# Patient Record
Sex: Female | Born: 1975 | Race: White | Hispanic: No | Marital: Married | Smoking: Former smoker
Health system: Southern US, Community
[De-identification: ages and names within clinical notes are randomized; demographics above are authoritative.]

## PROBLEM LIST (undated history)

## (undated) DIAGNOSIS — M1990 Unspecified osteoarthritis, unspecified site: Secondary | ICD-10-CM

## (undated) DIAGNOSIS — C801 Malignant (primary) neoplasm, unspecified: Secondary | ICD-10-CM

## (undated) HISTORY — PX: KNEE SURGERY: SHX244

## (undated) HISTORY — PX: FOOT SURGERY: SHX648

## (undated) HISTORY — DX: Unspecified osteoarthritis, unspecified site: M19.90

## (undated) HISTORY — PX: BREAST REDUCTION SURGERY: SHX8

## (undated) HISTORY — PX: TONSILLECTOMY: SUR1361

---

## 1998-06-02 ENCOUNTER — Encounter: Admission: RE | Admit: 1998-06-02 | Discharge: 1998-08-31 | Payer: Self-pay | Admitting: Gynecology

## 1998-06-02 ENCOUNTER — Other Ambulatory Visit: Admission: RE | Admit: 1998-06-02 | Discharge: 1998-06-02 | Payer: Self-pay | Admitting: Gynecology

## 1998-12-09 ENCOUNTER — Encounter: Payer: Self-pay | Admitting: Gynecology

## 1998-12-09 ENCOUNTER — Ambulatory Visit (HOSPITAL_COMMUNITY): Admission: RE | Admit: 1998-12-09 | Discharge: 1998-12-09 | Payer: Self-pay | Admitting: Gynecology

## 1999-04-11 ENCOUNTER — Other Ambulatory Visit: Admission: RE | Admit: 1999-04-11 | Discharge: 1999-04-11 | Payer: Self-pay | Admitting: Gynecology

## 1999-04-21 ENCOUNTER — Encounter: Admission: RE | Admit: 1999-04-21 | Discharge: 1999-07-20 | Payer: Self-pay | Admitting: Gynecology

## 1999-11-15 ENCOUNTER — Encounter (INDEPENDENT_AMBULATORY_CARE_PROVIDER_SITE_OTHER): Payer: Self-pay

## 1999-11-15 ENCOUNTER — Inpatient Hospital Stay (HOSPITAL_COMMUNITY): Admission: AD | Admit: 1999-11-15 | Discharge: 1999-11-18 | Payer: Self-pay | Admitting: Internal Medicine

## 1999-11-19 ENCOUNTER — Encounter: Admission: RE | Admit: 1999-11-19 | Discharge: 1999-12-19 | Payer: Self-pay | Admitting: Gynecology

## 1999-12-22 ENCOUNTER — Other Ambulatory Visit: Admission: RE | Admit: 1999-12-22 | Discharge: 1999-12-22 | Payer: Self-pay | Admitting: Gynecology

## 2000-09-21 ENCOUNTER — Encounter: Payer: Self-pay | Admitting: *Deleted

## 2000-09-21 ENCOUNTER — Encounter: Admission: RE | Admit: 2000-09-21 | Discharge: 2000-09-21 | Payer: Self-pay | Admitting: *Deleted

## 2000-12-26 ENCOUNTER — Other Ambulatory Visit: Admission: RE | Admit: 2000-12-26 | Discharge: 2000-12-26 | Payer: Self-pay | Admitting: Gynecology

## 2001-07-05 ENCOUNTER — Emergency Department (HOSPITAL_COMMUNITY): Admission: EM | Admit: 2001-07-05 | Discharge: 2001-07-05 | Payer: Self-pay | Admitting: Emergency Medicine

## 2001-09-02 ENCOUNTER — Encounter: Admission: RE | Admit: 2001-09-02 | Discharge: 2001-09-02 | Payer: Self-pay | Admitting: Family Medicine

## 2001-09-02 ENCOUNTER — Encounter: Payer: Self-pay | Admitting: Family Medicine

## 2001-10-21 ENCOUNTER — Ambulatory Visit (HOSPITAL_BASED_OUTPATIENT_CLINIC_OR_DEPARTMENT_OTHER): Admission: RE | Admit: 2001-10-21 | Discharge: 2001-10-21 | Payer: Self-pay | Admitting: Specialist

## 2001-10-21 ENCOUNTER — Encounter (INDEPENDENT_AMBULATORY_CARE_PROVIDER_SITE_OTHER): Payer: Self-pay | Admitting: *Deleted

## 2002-06-10 ENCOUNTER — Other Ambulatory Visit: Admission: RE | Admit: 2002-06-10 | Discharge: 2002-06-10 | Payer: Self-pay | Admitting: Gynecology

## 2002-07-03 ENCOUNTER — Encounter: Admission: RE | Admit: 2002-07-03 | Discharge: 2002-10-01 | Payer: Self-pay | Admitting: Gynecology

## 2003-07-17 ENCOUNTER — Other Ambulatory Visit: Admission: RE | Admit: 2003-07-17 | Discharge: 2003-07-17 | Payer: Self-pay | Admitting: Gynecology

## 2003-10-13 ENCOUNTER — Encounter: Admission: RE | Admit: 2003-10-13 | Discharge: 2003-11-18 | Payer: Self-pay | Admitting: Gynecology

## 2003-10-25 ENCOUNTER — Inpatient Hospital Stay (HOSPITAL_COMMUNITY): Admission: AD | Admit: 2003-10-25 | Discharge: 2003-10-25 | Payer: Self-pay | Admitting: Gynecology

## 2004-01-28 ENCOUNTER — Encounter (INDEPENDENT_AMBULATORY_CARE_PROVIDER_SITE_OTHER): Payer: Self-pay | Admitting: *Deleted

## 2004-01-28 ENCOUNTER — Inpatient Hospital Stay (HOSPITAL_COMMUNITY): Admission: RE | Admit: 2004-01-28 | Discharge: 2004-01-31 | Payer: Self-pay | Admitting: Gynecology

## 2005-03-05 ENCOUNTER — Emergency Department (HOSPITAL_COMMUNITY): Admission: EM | Admit: 2005-03-05 | Discharge: 2005-03-05 | Payer: Self-pay | Admitting: Emergency Medicine

## 2006-05-11 ENCOUNTER — Ambulatory Visit (HOSPITAL_BASED_OUTPATIENT_CLINIC_OR_DEPARTMENT_OTHER): Admission: RE | Admit: 2006-05-11 | Discharge: 2006-05-11 | Payer: Self-pay | Admitting: Orthopedic Surgery

## 2006-05-28 ENCOUNTER — Other Ambulatory Visit: Admission: RE | Admit: 2006-05-28 | Discharge: 2006-05-28 | Payer: Self-pay | Admitting: Gynecology

## 2006-05-28 ENCOUNTER — Encounter: Payer: Self-pay | Admitting: Internal Medicine

## 2006-10-08 ENCOUNTER — Encounter: Payer: Self-pay | Admitting: Internal Medicine

## 2007-09-10 ENCOUNTER — Emergency Department (HOSPITAL_BASED_OUTPATIENT_CLINIC_OR_DEPARTMENT_OTHER): Admission: EM | Admit: 2007-09-10 | Discharge: 2007-09-10 | Payer: Self-pay | Admitting: Emergency Medicine

## 2008-01-16 ENCOUNTER — Emergency Department (HOSPITAL_BASED_OUTPATIENT_CLINIC_OR_DEPARTMENT_OTHER): Admission: EM | Admit: 2008-01-16 | Discharge: 2008-01-16 | Payer: Self-pay | Admitting: Emergency Medicine

## 2008-03-05 ENCOUNTER — Ambulatory Visit: Payer: Self-pay | Admitting: Women's Health

## 2008-03-09 ENCOUNTER — Ambulatory Visit: Payer: Self-pay | Admitting: Gynecology

## 2008-08-27 ENCOUNTER — Other Ambulatory Visit: Admission: RE | Admit: 2008-08-27 | Discharge: 2008-08-27 | Payer: Self-pay | Admitting: Gynecology

## 2008-08-27 ENCOUNTER — Ambulatory Visit: Payer: Self-pay | Admitting: Women's Health

## 2008-08-27 ENCOUNTER — Encounter: Payer: Self-pay | Admitting: Women's Health

## 2008-09-24 ENCOUNTER — Ambulatory Visit: Payer: Self-pay | Admitting: Women's Health

## 2009-02-08 ENCOUNTER — Ambulatory Visit: Payer: Self-pay | Admitting: Women's Health

## 2009-02-22 ENCOUNTER — Ambulatory Visit: Payer: Self-pay | Admitting: Women's Health

## 2010-02-17 ENCOUNTER — Ambulatory Visit: Payer: Self-pay | Admitting: Women's Health

## 2010-04-19 NOTE — Letter (Signed)
Summary: [facility]   Imported By: Maryln Gottron 09/01/2009 11:00:05  _____________________________________________________________________  External Attachment:    Type:   Image     Comment:   External Document

## 2010-08-05 NOTE — Op Note (Signed)
NAMEMARIBELLE, Stacey Arroyo                             ACCOUNT NO.:  [id number]   MEDICAL RECORD NO.:  [id number]                   PATIENT TYPE:   LOCATION:                                       FACILITY:   PHYSICIAN:  Gerald L. Shon Hough, M.D.           DATE OF BIRTH:   DATE OF PROCEDURE:  10/21/2001  DATE OF DISCHARGE:                                 OPERATIVE REPORT   INDICATIONS:  A 35 year old lady who today on October 21, 2001, is  contemplating bilateral breast reductions.  Preoperative instructions were given as well as potential risks and possible  complications.  The patient wants to be a small C cup.   PROCEDURES PLANNED:  Bilateral breast reductions using the inferior pedicle  technique.  Bra size preoperative triple D to E.   PROCEDURE:  Preoperatively patient is set up and drawn for the inferior  pedicle reduction mammoplasty.  Drawings were done, remarking the nipple  areolar complexes back to 20 cm from the suprasternal notch.  The patient  then underwent general anesthesia intubated orally.  Prep was done to the  chest/breast areas in a routine fashion using Betadine soap and solution,  walled off with sterile towels, and draped so as to make a sterile field.  The wounds were scored with #15-blade.  The skin of the inferior pedicle was  de-epithelialized using #20 blade.  Medial and lateral fatty __________  pedicles were excised down to the underlying fascia.  Hemostasis was  maintained with the Bovie under anticoagulation.  The new keyhole area was  debulked as well as out laterally and excessive amounts of accessory breast  tissue were taken directly and then more taken over the latissimus dorsi in  the upper axillary regions using liposuctional assistance.  Texas catheter  28-3 or 4.  After removing the copious amounts of that, the flaps were then  transposed and stayed with 3-0 Prolene.  Subcutaneous closure was done with  3-0 Monocryl x2 layers, then a running  subcuticular stitch of 3-0 Monocryl  and 5-0 Monocryl throughout the inverted T.  The wounds were drained with 10-  Blake drains, fully fluted, placing __________  lateral portion of the  incision and secured with 3-0 Prolene.  The wounds were cleansed.  The  nipple areolar complexes had excellent blood supply at the end of the case  and gave symmetry to both breasts.  Same procedure was carried out on both  sides.  She was then taken to recovery in excellent condition.   ESTIMATED BLOOD LOSS:  150 cc.   SURGEON:  Yaakov Guthrie. Shon Hough, M.D.   ASSISTANT:  Alethia Berthold, first assistant.  Yaakov Guthrie. Shon Hough, M.D.   GLT/MEDQ  D:  10/21/2001  T:  10/23/2001  Job:  [id number]   cc:   Earvin Hansen L. Shon Hough, M.D.  [address]  [place]  [place] [address]  Fax: [phone]

## 2010-08-05 NOTE — Op Note (Signed)
[name], [name]                 ACCOUNT NO.:  [id number]   MEDICAL RECORD NO.:  [id number]          PATIENT TYPE:  INP   LOCATION:  9110                          [facility]ILITY:  [facility]   PHYSICIAN:  Juan H. Lily Peer, M.D.DATE OF BIRTH:  08-19-1975   DATE OF PROCEDURE:  01/28/2004  DATE OF DISCHARGE:                                 OPERATIVE REPORT   INDICATION FOR OPERATION:  A 35 year old gravida 3, para 1, AB 1 at 80 to 64  weeks estimated gestational age who has had a previous cesarean section.  For elective repeat.  The patient, during her 2nd trimester of pregnancy,  had been exposed to parvovirus B19 from her daughter; and the patient's  titers were found to be elevated, and she was followed with serial  ultrasound with no evidence of hydrops or any gross abnormality.  She  continued to do well and developed gestational diabetes and was on insulin  and had antepartum testing twice a week until the time of her delivery.   PREOPERATIVE DIAGNOSES:  1.  Term intrauterine pregnancy at 68 to 73 weeks estimated gestational age.  2.  Previous cesarean section.  Elective repeat.  3.  Gestational diabetes.  On insulin.  4.  Maternal obesity.  5.  Suspected large-for-gestational-age baby.  6.  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   POSTOPERATIVE DIAGNOSES:  1.  Term intrauterine pregnancy at 60 to 70 weeks estimated gestational age.  2.  Previous cesarean section.  Elective repeat.  3.  Gestational diabetes.  On insulin.  4.  Maternal obesity.  5.  Suspected large-for-gestational-age baby.  6.  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   ANESTHESIA:  Spinal.   SURGEON:  Juan H. Lily Peer, M.D.   FIRST ASSISTANT:  Ivor Costa. Farrel Gobble, M.D.   PROCEDURE PERFORMED:  Repeat lower uterine segment transverse cesarean  section.   FINDINGS:  A viable female with Apgars of 8, 9; weight of 8 pounds 10  ounces; clear amniotic fluid; normal maternal pelvic anatomy.   DESCRIPTION OF OPERATION:  After the patient had adequate counsel, she was  taken to the operating room where she underwent a successful spinal  anesthesia.  After the abdomen was prepped and draped in the usual sterile  fashion and a Foley catheter inserted in an effort to monitor urinary  output, the Pfannenstiel skin incision was made 2 cm below the symphysis  pubis.  The incision was carried down through the skin and subcutaneous  tissue, down to the rectus fascia whereby a midline nick was made.  The  fascia was incised in a transverse fashion.  The peritoneal cavity was  entered partially.  The bladder flap was established.  The lower uterine  segment was incised in a transverse fashion.  Clear amniotic fluid was  present.  The newborn was delivered.  A nuchal cord x1 was manually reduced.  The nasopharyngeal area was bulb-suctioned.  The cord was clamped and cut.  The newborn gave an immediate cry, was shown to the parents, and passed off  to the neonatologist who was in attendance, who gave  the above-mentioned  parameters.  After cord blood was obtained, the intrauterine cavity was  swept clear of many products of conception.  The uterus was not  exteriorized, and the lower uterine segment transverse incision was closed  with a running-locking stitch of 0 Vicryl suture.  The pelvic cavity was  once again copiously irrigated with normal saline solution.  Both tubes and  ovaries were inspected.  Appeared to be normal.  Closure was started.  The  visceral peritoneum was not closed, but the rectus fascia was closed with a  running stitch of 0 Vicryl suture.  Subcutaneous fat layer was approximated  with a running stitch of 3-0 Vicryl suture.  The skin edges were  approximated with skin clips followed by a placement of Xeroform gauze and 4  x dressing.  The patient was returned to the recovery room with stable vital  signs.  Blood loss for the procedure was 500 cubic centimeters.  IV  fluid:  3000 cubic centimeters of lactated Ringer's.  Urine output 450 cubic  centimeters included, and she received a gram of cefotetan prophylaxis.     Juan   JHF/MEDQ  D:  01/28/2004  T:  01/28/2004  Job:  [id number]

## 2010-08-05 NOTE — Op Note (Signed)
[facility]  Patient:    Stacey Arroyo, Stacey Arroyo                        MRN: [id number] Proc. Date: 11/15/99 Adm. Date:  [id number] Attending:  Douglass Rivers                           Operative Report  PREOPERATIVE DIAGNOSES:       1. Pregnancy at term.                               2. Pregnancy-induced hypertension.                               3. Failure to progress.  POSTOPERATIVE DIAGNOSES:      1. Pregnancy at term.                               2. Pregnancy-induced hypertension.                               3. Failure to progress.  OPERATION:                    Primary low transverse cesarean section.  SURGEON:                      Timothy P. Fontaine, M.D.  ASSISTANT:                    Additional scrub technician.  ANESTHESIA:                   Epidural anesthesia.  ESTIMATED BLOOD LOSS:         Less than 500 cc.  COMPLICATIONS:                None.  FINDINGS:                     Normal female infant, Apgars 8 and 9 at 1916,                               weight 9 pounds 4 ounces.  Pelvic anatomy noted                               to be normal.  DESCRIPTION OF PROCEDURE:     The patient was taken to the operating room and had her epidural catheter dosed and was placed in the left tilt supine position.  She received an abdominal preparation with Betadine scrub and Betadine solution and was draped in the usual fashion, already having a Foley catheter from labor and delivery.  After assuring and adequate anesthesia, the abdomen was sharply entered through a Pfannenstiel incision, achieving adequate hemostasis at all levels.  The bladder flap was sharply and bluntly developed without difficulty.  The uterus was sharply entered in the lower uterine segment.  The bulging membranes ruptured and noted to be clear and the incision was then bluntly extended laterally.  The infants head was then delivered through the  incision.  The nares and mouth  suctioned.  The rest of the infant delivered.  The cord doubly clamped and cut and the infant was handed to pediatrics in attendance.  Samples of cord blood were obtained.  The placenta was then spontaneously extruded and noted to be intact and was sent to pathology.  The uterus was then exteriorized.  The endometrial cavity explored with a sponge to remove all placental and membrane fragments and the uterine incision closed using #1 chromic in a running interlocking stitch. The incision line was irrigated. Adequate hemostasis achieved with electrocautery.  The uterus was then returned to the abdomen which was copiously irrigated, again showing adequate hemostasis.  The anterior fascia was then reapproximated using 0 Vicryl in a running stitch starting at the angle and meeting in the middle.  Subcutaneous tissue copiously irrigated and adequate hemostasis again achieved with electrocautery and the skin was reapproximated with staples.  A sterile dressing was applied and the patient was then taken to the recovery room in good condition having tolerated the procedure well.  The patient did receive 1 g Cefotan IV prophylaxis after cord clamping and she was noted to have free-flowing clear yellow urine at the end of the case. DD:  11/15/99 TD:  11/16/99 Job: [id number]

## 2010-08-05 NOTE — Discharge Summary (Signed)
[name], Stacey Arroyo                 ACCOUNT NO.:  [id number]   MEDICAL RECORD NO.:  [id number]          PATIENT TYPE:  INP   LOCATION:  9110                          [facility]ACILITY:  [facility]   PHYSICIAN:  Juan H. Lily Peer, M.D.DATE OF BIRTH:  Aug 13, 1975   DATE OF ADMISSION:  01/28/2004  DATE OF DISCHARGE:  01/31/2004                                 DISCHARGE SUMMARY   Total days hospitalized:  3.   HISTORY:  The patient is a 35 year old gravida 2 para 1 AB 1 who was  admitted on January 28, 2004 for a scheduled repeat cesarean section.  The  patient with two prior cesarean sections, was gestational diabetic on  insulin, and suspected fetal macrosomia.  The patient delivered a viable  female infant, Apgars of 8 and 9, over a lower uterine segment transverse  cesarean section, weight of 8 pounds 10 ounces, clear amniotic fluid, normal  maternal pelvic anatomy.  Blood loss from surgery was 500 mL and fluid  resuscitation 300 mL of lactated Ringer's, and she received a gram of  Cefotan preoperatively.  The patient's postoperative hemoglobin was 9.6;  hematocrit 28.2; platelet count 185,000.  Her Foley catheter was removed  after the 24 hours.  Her diet was advanced to a clear liquid diet.  On the  following postoperative day #2 she was advanced to a regular diet.  Her  incision was intact.  She continued to remain afebrile.  Blood type was O  positive, rubella immune, and she was ready to be discharged home on  postoperative day #3.   FINAL DIAGNOSES:  1.  Term intrauterine pregnancy at 74 and five-sevenths weeks gestation,      delivered.  2.  Previous cesarean section x2, for repeat.  3.  Gestational diabetic on insulin.  4.  Suspected fetal macrosomia.   PROCEDURE PERFORMED:  Repeat lower uterine segment transverse cesarean  section.   FINAL DISPOSITION AND FOLLOW-UP:  The patient was discharged home on her  postoperative day #3.  She was voiding well, tolerating a regular diet well,  passing flatus.  Her incision was intact, staples were removed, incision was  steri-stripped.  She was given a prescription of Percocet 7.5/500 to take  one p.o. q.4-6h. p.r.n. pain, she was instructed to continue her prenatal  vitamins and iron, and was to be followed up at [facility] office  in 6 weeks for a postpartum visit.     Juan   JHF/MEDQ  D:  03/03/2004  T:  03/03/2004  Job:  [id number]

## 2010-08-05 NOTE — H&P (Signed)
Stacey Arroyo, Stacey Arroyo                 ACCOUNT NO.:  [id number]   MEDICAL RECORD NO.:  [id number]          PATIENT TYPE:  INP   LOCATION:  NA                            [facility]   PHYSICIAN:  Juan H. Lily Peer, M.D.DATE OF BIRTH:  [birth year]   DATE OF ADMISSION:  DATE OF DISCHARGE:                                HISTORY & PHYSICAL   CHIEF COMPLAINT:  1.  Previous cesarean section x2 for elective repeat.  2.  Gestational diabetic on insulin.  3.  Suspect fetal macrosomia.   HISTORY OF PRESENT ILLNESS:  The patient is a 35 year old, gravida 2, para  1, AB 1 with an estimated date of confinement February 06, 2004.  At the  time of cesarean section, the patient will be 38 5/[redacted] weeks gestation. The  patient had exposure to Parvovirus B19  earlier in her pregnancy at  approximately 15 1/[redacted] weeks gestation.  Parvovirus B19, IgG and IgM were  found to be elevated. The patient was followed with serial ultrasounds.  There was no evidence of any hydrops and her maternal serum alpha  fetoprotein was normal and she continued to do well. At the time of her  diabetes screen, the patient had not passed the initial screen and three  hour GTT was also elevated. She subsequently was started on insulin and  gradually had been increased to 16 units at bedtime. She was followed with  serial NST's twice a week and weekly AFI's and antepartum testing and  continued to do well. The last ultrasound for estimated fetal weight was  done at [redacted] weeks gestation, the fetus was at 97 percentile at that point.  The patient is not interested in elective permanent sterilization. She had a  history of positive GBS in previous pregnancy.   REVIEW OF SYMPTOMS:  See [place] form.   PHYSICAL EXAMINATION:  VITAL SIGNS:  Blood pressure 110/72.  Urine was  negative for glucose and protein.  Weight was 291 pounds.  HEENT:  Unremarkable.  NECK:  Supple, trachea midline. No carotid bruits, no thyromegaly.  LUNGS:  Clear  to auscultation without rhonchi or wheezes.  HEART:  Regular rate and rhythm.  No murmurs or gallops.  BREASTS:  Not done.  ABDOMEN:  Soft, nontender without rebound or guarding.  Gravid uterus,  fundal height 42 cm.  PELVIC:  Cervix long, closed, posterior, somewhat narrow pelvis.  EXTREMITIES:  DTR's 1+, negative clonus, trace edema.   LABORATORY DATA:  Prenatal labs O positive blood, negative antibody screen,  VDRL was nonreactive, rubella immune, hepatitis B surface antigen, HIV were  negative. Abnormal diabetes screen.  GBS culture was positive.  Pap smear  was normal.   ASSESSMENT:  A 35 year old, gravida 2, para 1 at 39-[redacted] weeks gestation,  gestational diabetic on insulin, 16 units at bedtime. The patient was  instructed to hold off her insulin the evening before her surgery. The  patient also with Parvovirus B19 exposure and infectivity whereby her  Parvovirus B19, IgG and IgM were elevated.  The patient was followed since  [redacted] weeks gestation with serial ultrasounds with  no gross abnormality noted.  No evidence of hydrops and at third trimester she was followed with twice  week nonstress test and weekly AFI due to her gestational diabetes requiring  to be placed on insulin as well. Patient with two prior cesarean sections  requesting elective repeat, not interested in elective permanent  sterilization at this time. The risks, benefits, and pros and cons of the  operation  were discussed. All questions were answered and will follow accordingly. The  patient is scheduled for a repeat lower uterine segment transverse cesarean  section on Thursday, November 10 at 7:30 a.m. at [facility].     [place]   JHF/MEDQ  D:  01/27/2004  T:  01/27/2004  Job:  [id number]

## 2010-08-05 NOTE — Op Note (Signed)
Stacey Arroyo, Stacey Arroyo                 ACCOUNT NO.:  [id number]   MEDICAL RECORD NO.:  [id number]          PATIENT TYPE:  AMB   LOCATION:  [facility]                         FACILITY:  [facility]   PHYSICIAN:  Ollen Gross, M.D.    DATE OF BIRTH:  Nov 29, 1975   DATE OF PROCEDURE:  05/11/2006  DATE OF DISCHARGE:                               OPERATIVE REPORT   PREOPERATIVE DIAGNOSIS:  Left knee medial meniscal tear.   POSTOPERATIVE DIAGNOSIS:  Left knee medial meniscal tear.   PROCEDURE:  Left knee arthroscopy and debridement of medial meniscal  tear.   SURGEON:  Aluisio.   ASSISTANT.:  None.   ANESTHESIA:  General.   ESTIMATED BLOOD LOSS:  Minimal.   DRAIN:  None.   COMPLICATIONS:  None.   CONDITION:  Stable to recovery.   BRIEF CLINICAL NOTE:  Stacey Arroyo is a 35 year old female with a long  complex history in regards to her left knee.  She was going well up  until about 2 months ago.  She started to have increased pain and  mechanical type symptoms.  Exam and history suggested possible meniscal  tear.  An MRI was equivocal.  Given her persistent pain and mechanical  symptoms, she presents now for arthroscopy and debridement.   PROCEDURE IN DETAIL:  After successful administration of general  anesthetic, a tourniquet is placed high on the left thigh and left lower  extremity prepped and draped in the usual sterile fashion.  Standard  superomedial and inferolaterally incisions were made with an 11 blade  and the inflow cannula passed superomedial and camera passed  inferolateral.  Arthroscopic visualization proceeds.  There is some  synovitis in the suprapatellar area.  The undersurface of the patella  and trochlea look normal.  The medial and lateral gutters are  visualized, there are no loose bodies.  Flexion and valgus forceps  applied to knee and the medial compartment is entered.  She has a  significant tear in the body and posterior horn of the medial meniscus  which is unstable.   The medial femoral condyle and medial tibial plateau  look normal.  A spinal needle was used to localize the inferomedial  portal, a small incision made, dilator placed and then we debrided the  meniscus back to a stable base with baskets and a 4.2 mm shaver and  sealed it off with the ArthroCare device.  The condyle and plateau still  looked normal.  Intercondylar notch is visualized.  She has evidence of  the old ACL graft, which is rather attenuated.  The stitches in the  graft are visible but are not floating freely through the joint.  The  lateral compartment is entered and it looks normal.  The joint is again  inspected, no other tears or unstable cartilage noted.  The ArthroCare  was then used to debride some of the hypertrophic synovium in the  suprapatellar area.  The arthroscopic equipment was then removed from  the  inferior portals which were closed with interrupted #4-0 nylon.  There  was 20 mL of 0.25% Marcaine with epinephrine injected through  the inflow  cannula and then that was removed and that portal closed with nylon.  A  bulky sterile dressing was applied and she was awakened and transported  to recovery in stable condition.      Ollen Gross, M.D.  Electronically Signed     FA/MEDQ  D:  05/11/2006  T:  05/11/2006  Job:  [id number]

## 2014-02-05 ENCOUNTER — Other Ambulatory Visit (HOSPITAL_COMMUNITY)
Admission: RE | Admit: 2014-02-05 | Discharge: 2014-02-05 | Disposition: A | Discharge status: Home / Self Care | Payer: BC Managed Care – PPO | Source: Ambulatory Visit | Attending: Women's Health | Admitting: Women's Health

## 2014-02-05 ENCOUNTER — Encounter: Payer: Self-pay | Admitting: Women's Health

## 2014-02-05 ENCOUNTER — Ambulatory Visit (INDEPENDENT_AMBULATORY_CARE_PROVIDER_SITE_OTHER): Payer: BC Managed Care – PPO | Admitting: Women's Health

## 2014-02-05 VITALS — BP 146/88 | Ht 68.0 in | Wt 315.0 lb

## 2014-02-05 DIAGNOSIS — Z1151 Encounter for screening for human papillomavirus (HPV): Secondary | ICD-10-CM | POA: Insufficient documentation

## 2014-02-05 DIAGNOSIS — N920 Excessive and frequent menstruation with regular cycle: Secondary | ICD-10-CM

## 2014-02-05 DIAGNOSIS — Z01419 Encounter for gynecological examination (general) (routine) without abnormal findings: Secondary | ICD-10-CM | POA: Diagnosis present

## 2014-02-05 DIAGNOSIS — Z13220 Encounter for screening for lipoid disorders: Secondary | ICD-10-CM

## 2014-02-05 LAB — CBC WITH DIFFERENTIAL/PLATELET
Basophils Absolute: 0.1 10*3/uL (ref 0.0–0.1)
Basophils Relative: 1 % (ref 0–1)
EOS ABS: 0.1 10*3/uL (ref 0.0–0.7)
EOS PCT: 2 % (ref 0–5)
HEMATOCRIT: 37.9 % (ref 36.0–46.0)
HEMOGLOBIN: 12.5 g/dL (ref 12.0–15.0)
LYMPHS ABS: 2.6 10*3/uL (ref 0.7–4.0)
Lymphocytes Relative: 37 % (ref 12–46)
MCH: 28.5 pg (ref 26.0–34.0)
MCHC: 33 g/dL (ref 30.0–36.0)
MCV: 86.3 fL (ref 78.0–100.0)
MONO ABS: 0.5 10*3/uL (ref 0.1–1.0)
MONOS PCT: 7 % (ref 3–12)
MPV: 10.5 fL (ref 9.4–12.4)
Neutro Abs: 3.8 10*3/uL (ref 1.7–7.7)
Neutrophils Relative %: 53 % (ref 43–77)
Platelets: 348 10*3/uL (ref 150–400)
RBC: 4.39 MIL/uL (ref 3.87–5.11)
RDW: 14.4 % (ref 11.5–15.5)
WBC: 7.1 10*3/uL (ref 4.0–10.5)

## 2014-02-05 LAB — COMPREHENSIVE METABOLIC PANEL
ALBUMIN: 4.4 g/dL (ref 3.5–5.2)
ALT: 31 U/L (ref 0–35)
AST: 25 U/L (ref 0–37)
Alkaline Phosphatase: 74 U/L (ref 39–117)
BILIRUBIN TOTAL: 0.2 mg/dL (ref 0.2–1.2)
BUN: 19 mg/dL (ref 6–23)
CO2: 23 mEq/L (ref 19–32)
Calcium: 9.4 mg/dL (ref 8.4–10.5)
Chloride: 104 mEq/L (ref 96–112)
Creat: 0.87 mg/dL (ref 0.50–1.10)
GLUCOSE: 98 mg/dL (ref 70–99)
POTASSIUM: 4.6 meq/L (ref 3.5–5.3)
Sodium: 137 mEq/L (ref 135–145)
Total Protein: 7.5 g/dL (ref 6.0–8.3)

## 2014-02-05 LAB — TSH: TSH: 1.921 u[IU]/mL (ref 0.350–4.500)

## 2014-02-05 LAB — LIPID PANEL
Cholesterol: 216 mg/dL — ABNORMAL HIGH (ref 0–200)
HDL: 49 mg/dL (ref >39)
LDL Cholesterol: 143 mg/dL — ABNORMAL HIGH (ref 0–99)
Total CHOL/HDL Ratio: 4.4 Ratio
Triglycerides: 120 mg/dL (ref <150)
VLDL: 24 mg/dL (ref 0–40)

## 2014-02-05 NOTE — Progress Notes (Signed)
Stacey Arroyo 1975/08/06 [id number]    History:    Presents for annual exam.  Last time at our office 2011. Has moved to [facility] has seen a primary care. Struggling with chronic knee pain, osteoarthritis, seen at a pain clinic. Scheduled for a left knee replacement in December. Condoms contraception. Cycles have become extremely heavy lasting between 7 and 10 days monthly, a change. Changing pad every hour with large clots, soiling clothes. Normal Pap history.  Past medical history, past surgical history, family history and social history were all reviewed and documented in the EPIC chart. Stacey Arroyo 12, Stacey Arroyo 10 both doing well, home schooled. Father diabetes, mother healthy. Played college soccer.  ROS:  A  12 point ROS was performed and pertinent positives and negatives are included.  Exam:  Filed Vitals:   02/05/14 1107  BP: 146/88    General appearance:  Normal Thyroid:  Symmetrical, normal in size, without palpable masses or nodularity. Respiratory  Auscultation:  Clear without wheezing or rhonchi Cardiovascular  Auscultation:  Regular rate, without rubs, murmurs or gallops  Edema/varicosities:  Not grossly evident Abdominal  Soft,nontender, without masses, guarding or rebound.  Liver/spleen:  No organomegaly noted  Hernia:  None appreciated  Skin  Inspection:  Grossly normal   Breasts: Examined lying and sitting.     Right: Without masses, retractions, discharge or axillary adenopathy.     Left: Without masses, retractions, discharge or axillary adenopathy. Gentitourinary   Inguinal/mons:  Normal without inguinal adenopathy  External genitalia:  Normal  BUS/Urethra/Skene's glands:  Normal  Vagina:  Normal  Cervix:  Normal  Uterus:   normal in size, shape and contour.  Midline and mobile/limited exam morbid obesity  Adnexa/parametria:     Rt: Without masses or tenderness.   Lt: Without masses or tenderness.  Anus and perineum: Normal  Digital rectal exam: Normal  sphincter tone without palpated masses or tenderness  Assessment/Plan:  38 y.o. MWF G3P2 for annual exam.    Chronic knee pain/osteoarthritis-follow-up scheduled with orthopedist Menorrhagia/condoms Morbid obesity Borderline blood pressure  Plan: Blood pressure borderline, will check CBC, conference of metabolic panel, lipid panel, TSH, UA, Pap with HR HPV typing. Will print lab results follow-up with primary care if blood pressure persists greater than 130/80. Scheduled for left knee replacement in December. SBE's, annual screening mammogram to start now, instructed to schedule. Has limited activity due to chronic knee pain. Calcium rich diet, vitamin D 1000 daily encouraged. Aware of need to decrease calories for weight loss. Schedule sonohysterogram with Dr. Toney Rakes, HerOption ablation reviewed, slight risk for infection, perforation. Handout given and reviewed.    Huel Cote WHNP, 1:02 PM 02/05/2014

## 2014-02-05 NOTE — Patient Instructions (Signed)
Menorrhagia Menorrhagia is the medical term for when your menstrual periods are heavy or last longer than usual. With menorrhagia, every period you have may cause enough blood loss and cramping that you are unable to maintain your usual activities. CAUSES  In some cases, the cause of heavy periods is unknown, but a number of conditions may cause menorrhagia. Common causes include:  A problem with the hormone-producing thyroid gland (hypothyroid).  Noncancerous growths in the uterus (polyps or fibroids).  An imbalance of the estrogen and progesterone hormones.  One of your ovaries not releasing an egg during one or more months.  Side effects of having an intrauterine device (IUD).  Side effects of some medicines, such as anti-inflammatory medicines or blood thinners.  A bleeding disorder that stops your blood from clotting normally. SIGNS AND SYMPTOMS  During a normal period, bleeding lasts between 4 and 8 days. Signs that your periods are too heavy include:  You routinely have to change your pad or tampon every 1 or 2 hours because it is completely soaked.  You pass blood clots larger than 1 inch (2.5 cm) in size.  You have bleeding for more than 7 days.  You need to use pads and tampons at the same time because of heavy bleeding.  You need to wake up to change your pads or tampons during the night.  You have symptoms of anemia, such as tiredness, fatigue, or shortness of breath. DIAGNOSIS  Your health care provider will perform a physical exam and ask you questions about your symptoms and menstrual history. Other tests may be ordered based on what the health care provider finds during the exam. These tests can include:  Blood tests. Blood tests are used to check if you are pregnant or have hormonal changes, a bleeding or thyroid disorder, low iron levels (anemia), or other problems.  Endometrial biopsy. Your health care provider takes a sample of tissue from the inside of your  uterus to be examined under a microscope.  Pelvic ultrasound. This test uses sound waves to make a picture of your uterus, ovaries, and vagina. The pictures can show if you have fibroids or other growths.  Hysteroscopy. For this test, your health care provider will use a small telescope to look inside your uterus. Based on the results of your initial tests, your health care provider may recommend further testing. TREATMENT  Treatment may not be needed. If it is needed, your health care provider may recommend treatment with one or more medicines first. If these do not reduce bleeding enough, a surgical treatment might be an option. The best treatment for you will depend on:   Whether you need to prevent pregnancy.  Your desire to have children in the future.  The cause and severity of your bleeding.  Your opinion and personal preference.  Medicines for menorrhagia may include:  Birth control methods that use hormones. These include the pill, skin patch, vaginal ring, shots that you get every 3 months, hormonal IUD, and implant. These treatments reduce bleeding during your menstrual period.  Medicines that thicken blood and slow bleeding.  Medicines that reduce swelling, such as ibuprofen.  Medicines that contain a synthetic hormone called progestin.   Medicines that make the ovaries stop working for a short time.  You may need surgical treatment for menorrhagia if the medicines are unsuccessful. Treatment options include:  Dilation and curettage (D&C). In this procedure, your health care provider opens (dilates) your cervix and then scrapes or suctions tissue from  the lining of your uterus to reduce menstrual bleeding.  Operative hysteroscopy. This procedure uses a tiny tube with a light (hysteroscope) to view your uterine cavity and can help in the surgical removal of a polyp that may be causing heavy periods.  Endometrial ablation. Through various techniques, your health care  provider permanently destroys the entire lining of your uterus (endometrium). After endometrial ablation, most women have little or no menstrual flow. Endometrial ablation reduces your ability to become pregnant.  Endometrial resection. This surgical procedure uses an electrosurgical wire loop to remove the lining of the uterus. This procedure also reduces your ability to become pregnant.  Hysterectomy. Surgical removal of the uterus and cervix is a permanent procedure that stops menstrual periods. Pregnancy is not possible after a hysterectomy. This procedure requires anesthesia and hospitalization. HOME CARE INSTRUCTIONS   Only take over-the-counter or prescription medicines as directed by your health care provider. Take prescribed medicines exactly as directed. Do not change or switch medicines without consulting your health care provider.  Take any prescribed iron pills exactly as directed by your health care provider. Long-term heavy bleeding may result in low iron levels. Iron pills help replace the iron your body lost from heavy bleeding. Iron may cause constipation. If this becomes a problem, increase the bran, fruits, and roughage in your diet.  Do not take aspirin or medicines that contain aspirin 1 week before or during your menstrual period. Aspirin may make the bleeding worse.  If you need to change your sanitary pad or tampon more than once every 2 hours, stay in bed and rest as much as possible until the bleeding stops.  Eat well-balanced meals. Eat foods high in iron. Examples are leafy green vegetables, meat, liver, eggs, and whole grain breads and cereals. Do not try to lose weight until the abnormal bleeding has stopped and your blood iron level is back to normal. SEEK MEDICAL CARE IF:   You soak through a pad or tampon every 1 or 2 hours, and this happens every time you have a period.  You need to use pads and tampons at the same time because you are bleeding so much.  You  need to change your pad or tampon during the night.  You have a period that lasts for more than 8 days.  You pass clots bigger than 1 inch wide.  You have irregular periods that happen more or less often than once a month.  You feel dizzy or faint.  You feel very weak or tired.  You feel short of breath or feel your heart is beating too fast when you exercise.  You have nausea and vomiting or diarrhea while you are taking your medicine.  You have any problems that may be related to the medicine you are taking. SEEK IMMEDIATE MEDICAL CARE IF:   You soak through 4 or more pads or tampons in 2 hours.  You have any bleeding while you are pregnant. MAKE SURE YOU:   Understand these instructions.  Will watch your condition.  Will get help right away if you are not doing well or get worse. Document Released: 03/06/2005 Document Revised: 03/11/2013 Document Reviewed: 08/25/2012 [facility] Patient Information 2015 [place], [place]. This information is not intended to replace advice given to you by your health care provider. Make sure you discuss any questions you have with your health care provider. Health Maintenance Adopting a healthy lifestyle and getting preventive care can go a long way to promote health and wellness. Talk  with your health care provider about what schedule of regular examinations is right for you. This is a good chance for you to check in with your provider about disease prevention and staying healthy. In between checkups, there are plenty of things you can do on your own. Experts have done a lot of research about which lifestyle changes and preventive measures are most likely to keep you healthy. Ask your health care provider for more information. WEIGHT AND DIET  Eat a healthy diet  Be sure to include plenty of vegetables, fruits, low-fat dairy products, and lean protein.  Do not eat a lot of foods high in solid fats, added sugars, or salt.  Get regular exercise.  This is one of the most important things you can do for your health.  Most adults should exercise for at least 150 minutes each week. The exercise should increase your heart rate and make you sweat (moderate-intensity exercise).  Most adults should also do strengthening exercises at least twice a week. This is in addition to the moderate-intensity exercise.  Maintain a healthy weight  Body mass index (BMI) is a measurement that can be used to identify possible weight problems. It estimates body fat based on height and weight. Your health care provider can help determine your BMI and help you achieve or maintain a healthy weight.  For females 41 years of age and older:   A BMI below 18.5 is considered underweight.  A BMI of 18.5 to 24.9 is normal.  A BMI of 25 to 29.9 is considered overweight.  A BMI of 30 and above is considered obese.  Watch levels of cholesterol and blood lipids  You should start having your blood tested for lipids and cholesterol at 40 years of age, then have this test every 5 years.  You may need to have your cholesterol levels checked more often if:  Your lipid or cholesterol levels are high.  You are older than 40 years of age.  You are at high risk for heart disease.  CANCER SCREENING   Lung Cancer  Lung cancer screening is recommended for adults 60-27 years old who are at high risk for lung cancer because of a history of smoking.  A yearly low-dose CT scan of the lungs is recommended for people who:  Currently smoke.  Have quit within the past 15 years.  Have at least a 30-pack-year history of smoking. A pack year is smoking an average of one pack of cigarettes a day for 1 year.  Yearly screening should continue until it has been 15 years since you quit.  Yearly screening should stop if you develop a health problem that would prevent you from having lung cancer treatment.  Breast Cancer  Practice breast self-awareness. This means  understanding how your breasts normally appear and feel.  It also means doing regular breast self-exams. Let your health care provider know about any changes, no matter how small.  If you are in your 20s or 30s, you should have a clinical breast exam (CBE) by a health care provider every 1-3 years as part of a regular health exam.  If you are 36 or older, have a CBE every year. Also consider having a breast X-ray (mammogram) every year.  If you have a family history of breast cancer, talk to your health care provider about genetic screening.  If you are at high risk for breast cancer, talk to your health care provider about having an MRI and a mammogram every  year.  Breast cancer gene (BRCA) assessment is recommended for women who have family members with BRCA-related cancers. BRCA-related cancers include:  Breast.  Ovarian.  Tubal.  Peritoneal cancers.  Results of the assessment will determine the need for genetic counseling and BRCA1 and BRCA2 testing. Cervical Cancer Routine pelvic examinations to screen for cervical cancer are no longer recommended for nonpregnant women who are considered low risk for cancer of the pelvic organs (ovaries, uterus, and vagina) and who do not have symptoms. A pelvic examination may be necessary if you have symptoms including those associated with pelvic infections. Ask your health care provider if a screening pelvic exam is right for you.   The Pap test is the screening test for cervical cancer for women who are considered at risk.  If you had a hysterectomy for a problem that was not cancer or a condition that could lead to cancer, then you no longer need Pap tests.  If you are older than 65 years, and you have had normal Pap tests for the past 10 years, you no longer need to have Pap tests.  If you have had past treatment for cervical cancer or a condition that could lead to cancer, you need Pap tests and screening for cancer for at least 20 years  after your treatment.  If you no longer get a Pap test, assess your risk factors if they change (such as having a new sexual partner). This can affect whether you should start being screened again.  Some women have medical problems that increase their chance of getting cervical cancer. If this is the case for you, your health care provider may recommend more frequent screening and Pap tests.  The human papillomavirus (HPV) test is another test that may be used for cervical cancer screening. The HPV test looks for the virus that can cause cell changes in the cervix. The cells collected during the Pap test can be tested for HPV.  The HPV test can be used to screen women 36 years of age and older. Getting tested for HPV can extend the interval between normal Pap tests from three to five years.  An HPV test also should be used to screen women of any age who have unclear Pap test results.  After 40 years of age, women should have HPV testing as often as Pap tests.  Colorectal Cancer  This type of cancer can be detected and often prevented.  Routine colorectal cancer screening usually begins at 40 years of age and continues through 40 years of age.  Your health care provider may recommend screening at an earlier age if you have risk factors for colon cancer.  Your health care provider may also recommend using home test kits to check for hidden blood in the stool.  A small camera at the end of a tube can be used to examine your colon directly (sigmoidoscopy or colonoscopy). This is done to check for the earliest forms of colorectal cancer.  Routine screening usually begins at age 35.  Direct examination of the colon should be repeated every 5-10 years through 40 years of age. However, you may need to be screened more often if early forms of precancerous polyps or small growths are found. Skin Cancer  Check your skin from head to toe regularly.  Tell your health care provider about any new  moles or changes in moles, especially if there is a change in a mole's shape or color.  Also tell your health care provider  if you have a mole that is larger than the size of a pencil eraser.  Always use sunscreen. Apply sunscreen liberally and repeatedly throughout the day.  Protect yourself by wearing long sleeves, pants, a wide-brimmed hat, and sunglasses whenever you are outside. HEART DISEASE, DIABETES, AND HIGH BLOOD PRESSURE   Have your blood pressure checked at least every 1-2 years. High blood pressure causes heart disease and increases the risk of stroke.  If you are between 68 years and 48 years old, ask your health care provider if you should take aspirin to prevent strokes.  Have regular diabetes screenings. This involves taking a blood sample to check your fasting blood sugar level.  If you are at a normal weight and have a low risk for diabetes, have this test once every three years after 40 years of age.  If you are overweight and have a high risk for diabetes, consider being tested at a younger age or more often. PREVENTING INFECTION  Hepatitis B  If you have a higher risk for hepatitis B, you should be screened for this virus. You are considered at high risk for hepatitis B if:  You were born in a country where hepatitis B is common. Ask your health care provider which countries are considered high risk.  Your parents were born in a high-risk country, and you have not been immunized against hepatitis B (hepatitis B vaccine).  You have HIV or AIDS.  You use needles to inject street drugs.  You live with someone who has hepatitis B.  You have had sex with someone who has hepatitis B.  You get hemodialysis treatment.  You take certain medicines for conditions, including cancer, organ transplantation, and autoimmune conditions. Hepatitis C  Blood testing is recommended for:  Everyone born from [birth year] through 1967.  Anyone with known risk factors for hepatitis  C. Sexually transmitted infections (STIs)  You should be screened for sexually transmitted infections (STIs) including gonorrhea and chlamydia if:  You are sexually active and are younger than 40 years of age.  You are older than 40 years of age and your health care provider tells you that you are at risk for this type of infection.  Your sexual activity has changed since you were last screened and you are at an increased risk for chlamydia or gonorrhea. Ask your health care provider if you are at risk.  If you do not have HIV, but are at risk, it may be recommended that you take a prescription medicine daily to prevent HIV infection. This is called pre-exposure prophylaxis (PrEP). You are considered at risk if:  You are sexually active and do not regularly use condoms or know the HIV status of your partner(s).  You take drugs by injection.  You are sexually active with a partner who has HIV. Talk with your health care provider about whether you are at high risk of being infected with HIV. If you choose to begin PrEP, you should first be tested for HIV. You should then be tested every 3 months for as long as you are taking PrEP.  PREGNANCY   If you are premenopausal and you may become pregnant, ask your health care provider about preconception counseling.  If you may become pregnant, take 400 to 800 micrograms (mcg) of folic acid every day.  If you want to prevent pregnancy, talk to your health care provider about birth control (contraception). OSTEOPOROSIS AND MENOPAUSE   Osteoporosis is a disease in which the bones lose  minerals and strength with aging. This can result in serious bone fractures. Your risk for osteoporosis can be identified using a bone density scan.  If you are 2 years of age or older, or if you are at risk for osteoporosis and fractures, ask your health care provider if you should be screened.  Ask your health care provider whether you should take a calcium or  vitamin D supplement to lower your risk for osteoporosis.  Menopause may have certain physical symptoms and risks.  Hormone replacement therapy may reduce some of these symptoms and risks. Talk to your health care provider about whether hormone replacement therapy is right for you.  HOME CARE INSTRUCTIONS   Schedule regular health, dental, and eye exams.  Stay current with your immunizations.   Do not use any tobacco products including cigarettes, chewing tobacco, or electronic cigarettes.  If you are pregnant, do not drink alcohol.  If you are breastfeeding, limit how much and how often you drink alcohol.  Limit alcohol intake to no more than 1 drink per day for nonpregnant women. One drink equals 12 ounces of beer, 5 ounces of wine, or 1 ounces of hard liquor.  Do not use street drugs.  Do not share needles.  Ask your health care provider for help if you need support or information about quitting drugs.  Tell your health care provider if you often feel depressed.  Tell your health care provider if you have ever been abused or do not feel safe at home. Document Released: 09/19/2010 Document Revised: 07/21/2013 Document Reviewed: 02/05/2013 [facility] Patient Information 2015 [place], [place]. This information is not intended to replace advice given to you by your health care provider. Make sure you discuss any questions you have with your health care provider.

## 2014-02-06 LAB — URINALYSIS W MICROSCOPIC + REFLEX CULTURE
BILIRUBIN URINE: NEGATIVE
CRYSTALS: NONE SEEN
Casts: NONE SEEN
Glucose, UA: NEGATIVE mg/dL
Ketones, ur: NEGATIVE mg/dL
NITRITE: NEGATIVE
Protein, ur: NEGATIVE mg/dL
SPECIFIC GRAVITY, URINE: 1.014 (ref 1.005–1.030)
SQUAMOUS EPITHELIAL / LPF: NONE SEEN
UROBILINOGEN UA: 0.2 mg/dL (ref 0.0–1.0)
pH: 7.5 (ref 5.0–8.0)

## 2014-02-07 LAB — URINE CULTURE: Colony Count: >=100000

## 2014-02-09 LAB — CYTOLOGY - PAP

## 2014-02-11 ENCOUNTER — Telehealth: Payer: Self-pay

## 2014-02-11 ENCOUNTER — Other Ambulatory Visit: Payer: Self-pay | Admitting: Women's Health

## 2014-02-11 MED ORDER — FLUCONAZOLE 150 MG PO TABS: 150.0000 mg | ORAL_TABLET | Freq: Once | ORAL | Status: DC

## 2014-02-11 NOTE — Telephone Encounter (Signed)
-----   Message from Huel Cote, NP sent at 02/09/2014  5:17 PM EST ----- Please call and review Pap normal with negative high risk HPV but positive for yeast, Diflucan 151 dose. Also review CBC, CMP and TSH all normal. Cholesterol slightly elevated, increase exercise when able, decrease carbs and less than 20 g saturated fat daily.

## 2014-02-11 NOTE — Telephone Encounter (Signed)
I called patient about results. She wanted me to relay to you that she went to see new PCP and "it went okay".  She said that her BP was elevated there as well and was 140/84.  They started her on Lisinopril daily.  She is to monitor and follow up with them.  She just wanted you to know.

## 2014-02-17 HISTORY — PX: JOINT REPLACEMENT: SHX530

## 2014-02-24 ENCOUNTER — Other Ambulatory Visit: Payer: Self-pay | Admitting: Gynecology

## 2014-02-24 ENCOUNTER — Other Ambulatory Visit: Payer: Self-pay | Admitting: Women's Health

## 2014-02-24 DIAGNOSIS — N920 Excessive and frequent menstruation with regular cycle: Secondary | ICD-10-CM

## 2014-03-06 ENCOUNTER — Ambulatory Visit: Payer: BC Managed Care – PPO | Admitting: Gynecology

## 2014-03-06 ENCOUNTER — Other Ambulatory Visit: Payer: BC Managed Care – PPO

## 2014-04-14 ENCOUNTER — Other Ambulatory Visit: Payer: Self-pay | Admitting: Gynecology

## 2014-04-14 DIAGNOSIS — N920 Excessive and frequent menstruation with regular cycle: Secondary | ICD-10-CM

## 2014-04-30 ENCOUNTER — Telehealth: Payer: Self-pay | Admitting: *Deleted

## 2014-04-30 MED ORDER — MEGESTROL ACETATE 40 MG PO TABS: ORAL_TABLET | ORAL | Status: DC

## 2014-04-30 NOTE — Telephone Encounter (Signed)
Pt informed with the below note, Rx sent. 

## 2014-04-30 NOTE — Telephone Encounter (Signed)
Pt is scheduled for [facility] tomorrow, she is still bleeding on day 12 of cycle, heavy wearing tampon and pad, asked if you still want her to keep scheduled [facility]? Please advise

## 2014-04-30 NOTE — Telephone Encounter (Signed)
If she is sexually active and her urine pregnancy test is negative please start her on Megace 40 mg one by mouth 3 times a day starting today and then twice a day starting tomorrow for 10 days

## 2014-05-01 ENCOUNTER — Ambulatory Visit: Payer: Self-pay | Admitting: Gynecology

## 2014-05-01 ENCOUNTER — Other Ambulatory Visit: Payer: Self-pay

## 2014-05-06 ENCOUNTER — Ambulatory Visit: Payer: BC Managed Care – PPO | Admitting: Gynecology

## 2014-05-06 ENCOUNTER — Other Ambulatory Visit: Payer: BC Managed Care – PPO

## 2014-05-25 ENCOUNTER — Other Ambulatory Visit: Payer: Self-pay | Admitting: Gynecology

## 2014-05-25 DIAGNOSIS — N920 Excessive and frequent menstruation with regular cycle: Secondary | ICD-10-CM

## 2014-05-26 ENCOUNTER — Telehealth: Payer: Self-pay | Admitting: *Deleted

## 2014-05-26 ENCOUNTER — Telehealth: Payer: Self-pay | Admitting: Gynecology

## 2014-05-26 MED ORDER — MEGESTROL ACETATE 40 MG PO TABS: 40.0000 mg | ORAL_TABLET | Freq: Three times a day (TID) | ORAL | Status: DC

## 2014-05-26 NOTE — Telephone Encounter (Signed)
Pt informed, Rx sent. 

## 2014-05-26 NOTE — Telephone Encounter (Signed)
Pt is scheduled for [facility] on Friday, on day 8 of her cycle, heavy bleeding changing every time using the bathroom. Pt was given megace 40 mg one by mouth 3 times a day starting today and then twice a day x 10days last month. Pt said her concern is this is going to happen every month and she will be unable to do the [facility]. OV was offered,  Pt would like you recommendations?  Please advise

## 2014-05-26 NOTE — Telephone Encounter (Signed)
05/26/14-Pt was informed today that her [facility] ins benefits for sonohysterogram are as follows: [id number] covered at 100%. Pt has $217.09 left of her deductible then [phone] are covered at 80/20. The cost for deductible and 2 u/s will be $279.74. [id number] would be $48.90 additional. She will let us know if she can pay in full date of service but might only be able to pay $100.00 that day which I told her is the least we can accept.wl

## 2014-05-26 NOTE — Telephone Encounter (Signed)
Increase her Megace 40 mg 3 times a day and we will still see her on since Friday for the sonohysterogram regardless

## 2014-05-29 ENCOUNTER — Encounter: Payer: Self-pay | Admitting: Gynecology

## 2014-05-29 ENCOUNTER — Other Ambulatory Visit: Payer: Self-pay | Admitting: Gynecology

## 2014-05-29 ENCOUNTER — Ambulatory Visit (INDEPENDENT_AMBULATORY_CARE_PROVIDER_SITE_OTHER): Payer: BLUE CROSS/BLUE SHIELD | Admitting: Gynecology

## 2014-05-29 ENCOUNTER — Ambulatory Visit (INDEPENDENT_AMBULATORY_CARE_PROVIDER_SITE_OTHER): Payer: BLUE CROSS/BLUE SHIELD

## 2014-05-29 DIAGNOSIS — N920 Excessive and frequent menstruation with regular cycle: Secondary | ICD-10-CM

## 2014-05-29 DIAGNOSIS — R102 Pelvic and perineal pain: Secondary | ICD-10-CM

## 2014-05-29 DIAGNOSIS — N644 Mastodynia: Secondary | ICD-10-CM | POA: Diagnosis not present

## 2014-05-29 DIAGNOSIS — N938 Other specified abnormal uterine and vaginal bleeding: Secondary | ICD-10-CM | POA: Diagnosis not present

## 2014-05-29 DIAGNOSIS — L680 Hirsutism: Secondary | ICD-10-CM | POA: Insufficient documentation

## 2014-05-29 NOTE — Patient Instructions (Signed)
Breast Tenderness Breast tenderness is a common problem for women of all ages. Breast tenderness may cause mild discomfort to severe pain. It has a variety of causes. Your health care provider will find out the likely cause of your breast tenderness by examining your breasts, asking you about symptoms, and ordering some tests. Breast tenderness usually does not mean you have breast cancer. HOME CARE INSTRUCTIONS  Breast tenderness often can be handled at home. You can try:  Getting fitted for a new bra that provides more support, especially during exercise.  Wearing a more supportive bra or sports bra while sleeping when your breasts are very tender.  If you have a breast injury, apply ice to the area:  Put ice in a plastic bag.  Place a towel between your skin and the bag.  Leave the ice on for 20 minutes, 2-3 times a day.  If your breasts are too full of milk as a result of breastfeeding, try:  Expressing milk either by hand or with a breast pump.  Applying a warm compress to the breasts for relief.  Taking over-the-counter pain relievers, if approved by your health care provider.  Taking other medicines that your health care provider prescribes. These may include antibiotic medicines or birth control pills. Over the long term, your breast tenderness might be eased if you:  Cut down on caffeine.  Reduce the amount of fat in your diet. Keep a log of the days and times when your breasts are most tender. This will help you and your health care provider find the cause of the tenderness and how to relieve it. Also, learn how to do breast exams at home. This will help you notice if you have an unusual growth or lump that could cause tenderness. SEEK MEDICAL CARE IF:   Any part of your breast is hard, red, and hot to the touch. This could be a sign of infection.  Fluid is coming out of your nipples (and you are not breastfeeding). Especially watch for blood or pus.  You have a fever  as well as breast tenderness.  You have a new or painful lump in your breast that remains after your menstrual period ends.  You have tried to take care of the pain at home, but it has not gone away.  Your breast pain is getting worse, or the pain is making it hard to do the things you usually do during your day. Document Released: 02/17/2008 Document Revised: 11/06/2012 Document Reviewed: 10/03/2012 [facility] Patient Information 2015 [place], [place]. This information is not intended to replace advice given to you by your health care provider. Make sure you discuss any questions you have with your health care provider. Endometrial Ablation Endometrial ablation removes the lining of the uterus (endometrium). It is usually a same-day, outpatient treatment. Ablation helps avoid major surgery, such as surgery to remove the cervix and uterus (hysterectomy). After endometrial ablation, you will have little or no menstrual bleeding and may not be able to have children. However, if you are premenopausal, you will need to use a reliable method of birth control following the procedure because of the small chance that pregnancy can occur. There are different reasons to have this procedure, which include:  Heavy periods.  Bleeding that is causing anemia.  Irregular bleeding.  Bleeding fibroids on the lining inside the uterus if they are smaller than 3 centimeters. This procedure should not be done if:  You want children in the future.  You have severe  cramps with your menstrual period.  You have precancerous or cancerous cells in your uterus.  You were recently pregnant.  You have gone through menopause.  You have had major surgery on the uterus, such as a cesarean delivery. LET [facility] CARE PROVIDER KNOW ABOUT:  Any allergies you have.  All medicines you are taking, including vitamins, herbs, eye drops, creams, and over-the-counter medicines.  Previous problems you or members of your  family have had with the use of anesthetics.  Any blood disorders you have.  Previous surgeries you have had.  Medical conditions you have. RISKS AND COMPLICATIONS  Generally, this is a safe procedure. However, as with any procedure, complications can occur. Possible complications include:  Perforation of the uterus.  Bleeding.  Infection of the uterus, bladder, or vagina.  Injury to surrounding organs.  An air bubble to the lung (air embolus).  Pregnancy following the procedure.  Failure of the procedure to help the problem, requiring hysterectomy.  Decreased ability to diagnose cancer in the lining of the uterus. BEFORE THE PROCEDURE  The lining of the uterus must be tested to make sure there is no pre-cancerous or cancer cells present.  An ultrasound may be performed to look at the size of the uterus and to check for abnormalities.  Medicines may be given to thin the lining of the uterus. PROCEDURE  During the procedure, your health care provider will use a tool called a resectoscope to help see inside your uterus. There are different ways to remove the lining of your uterus.   Radiofrequency - This method uses a radiofrequency-alternating electric current to remove the lining of the uterus.  Cryotherapy - This method uses extreme cold to freeze the lining of the uterus.  Heated-Free Liquid - This method uses heated salt (saline) solution to remove the lining of the uterus.  Microwave - This method uses high-energy microwaves to heat up the lining of the uterus to remove it.  Thermal balloon - This method involves inserting a catheter with a balloon tip into the uterus. The balloon tip is filled with heated fluid to remove the lining of the uterus. AFTER THE PROCEDURE  After your procedure, do not have sexual intercourse or insert anything into your vagina until permitted by your health care provider. After the procedure, you may experience:  Cramps.  Vaginal  discharge.  Frequent urination. Document Released: 01/14/2004 Document Revised: 11/06/2012 Document Reviewed: 08/07/2012 [facility] Patient Information 2015 [place], [place]. This information is not intended to replace advice given to you by your health care provider. Make sure you discuss any questions you have with your health care provider.

## 2014-05-29 NOTE — Progress Notes (Signed)
   Patient presented to the office today for further evaluation of her menorrhagia. Patient is currently on Megace 40 mg twice a day. She is here for sonohysterogram as well as an endometrial biopsy. Patient also was complaining of hair underneath her chin as well as breast tenderness on both breasts. Patient is currently using condoms for contraception. Her most recent blood work in November 2015 had demonstrated a normal compress metabolic panel, normal CBC, lipid profile was slight elevation of total cholesterol at 216 and her LDL at 143 for which she's working on a diet next her size now. She also had a normal TSH. Recent physical exam and rest exam were normal.  Patient reports normal mammogram less than 6 months ago.  Ultrasound/sono hysterogram today: Uterus measuring 9.7 x 7.1 x 5.3 cm with endometrial stripe of 13.4 mm. Uterus with normal endometrium right and left ovary were normal. No adnexal masses. Cervix was cleansed with Betadine solution and a sterile catheter was introduced into the uterine cavity and normal saline was instilled. There was no intracavitary defects noted. After this a cervix was cleansed with Betadine solution once again. A single-tooth tenaculum was placed on the anterior cervical lip. A sterile Pipelle was introduced into the uterine cavity to obtain tissue which was submitted for histological evaluation.  Assessment/plan: Menorrhagia with normal ultrasound. Sonohysterogram normal. Endometrial biopsy done today pathology report pending. We'll have patient check her CBC when she comes in for preop since we are recommended she consider endometrial ablation. Patient had tried the IUD in the past as well as oral contraceptive pills and neither worked well for her. Literature information on her option endometrial ablation was provided. As for her mastodynia recommended she cut down IN-containing products and begin taking vitamin E 600 units daily. As to her hirsutism we are  going to check a total testosterone level along with DHEAS, and 17 hydroxyprogesterone. She was reminded to do her monthly breast exam.

## 2014-05-30 LAB — TESTOSTERONE: Testosterone: 20 ng/dL (ref 10–70)

## 2014-06-01 ENCOUNTER — Telehealth: Payer: Self-pay | Admitting: Gynecology

## 2014-06-01 NOTE — Telephone Encounter (Signed)
Patient contacted to inform her that her endometrial biopsy done as a result of her menorrhagia recently is inconclusive. I spoke with Dr. Tillman Sers pathologist who is going to submit slides to Dr. Annitta Jersey at [facility] who is a leading GYN oncologist for final confirmation to rule out the possibility of malignancy. It will take approximately 7-10 days to get the result back.

## 2014-06-02 ENCOUNTER — Telehealth: Payer: Self-pay

## 2014-06-02 LAB — 17-HYDROXYPROGESTERONE: 17-OH-PROGESTERONE, LC/MS/MS: 24 ng/dL

## 2014-06-02 NOTE — Telephone Encounter (Signed)
She said to remind you that she had a Mirena years ago 2008? And did not do well with it. She said it went into her cervix? And did not help bleeding.  Would you still want her to try it?

## 2014-06-02 NOTE — Telephone Encounter (Signed)
I spoke with patient about ins benefits for Her Option Ablation.  Patient wants to hold off scheduling until she hears from Dr. Moshe Salisbury regarding her biopsy results. It is being sent to [facility] to be reviewed there and results will take 7-10 days. I offered to go ahead and book a time to hold for her but she declined.  Dr. Moshe Salisbury- Your Note to me had said patient is "on Megace for one more week then can go on Prometrium 200mg  daily until ablation."  Patient does not want me to schedule now so not sure what you want to do about this.

## 2014-06-02 NOTE — Telephone Encounter (Signed)
Patient informed. She will stay in touch.  I sent note to Abigail Butts to check benefits for her for Mirena.

## 2014-06-02 NOTE — Telephone Encounter (Signed)
Patient said after you two talked yesterday someone called her back and told her you wanted her to take the Megace four times daily instead of 3.  She took four yesterday and has already taken two today and still is bleeding fairly heavy. Saturating a pad every few hours. She has seen no change in her bleeding being on the Megace for one week.

## 2014-06-02 NOTE — Telephone Encounter (Signed)
Do not schedule ablation or prescribed Prometrium. Patient may very likely have endometrial cancer. We are waiting for confirmation from [facility]. You could: Megace 40 mg that she can take 3 times a day if she continues bleeding until we get the pathology report back. Please check with her how much she has left. I have told patient that we are waiting for pathology report that we are not certain yet about this changes seen in the pathology specimen and the local pathologist wanted a second opinion.

## 2014-06-02 NOTE — Telephone Encounter (Signed)
Yes we will do it under ultrasound guidance

## 2014-06-02 NOTE — Telephone Encounter (Signed)
That was yesterday this is today she can take 4 tablets a day to help with her bleeding. If not she needs to come by the office tomorrow or Thursday so we can place a Mirena IUD to help also with the bleeding as we had discussed

## 2014-06-03 ENCOUNTER — Telehealth: Payer: Self-pay

## 2014-06-03 ENCOUNTER — Other Ambulatory Visit: Payer: Self-pay | Admitting: Gynecology

## 2014-06-03 LAB — DHEA: DHEA: 239 ng/dL (ref 102–1185)

## 2014-06-03 MED ORDER — MEGESTROL ACETATE 40 MG PO TABS: 40.0000 mg | ORAL_TABLET | Freq: Four times a day (QID) | ORAL | Status: DC

## 2014-06-03 NOTE — Telephone Encounter (Signed)
Not needed

## 2014-06-03 NOTE — Telephone Encounter (Signed)
Patient informed. 

## 2014-06-03 NOTE — Telephone Encounter (Signed)
Patient called to let you know that she has been on the Megace qid x 2 days  and her bleeding has lightened considerably and she is just spotting now but still bright red.  She asked if okay if she continues spotting on Megace or does she need to continue to report.  She will definitely call if for some reason it increased again.

## 2014-06-03 NOTE — Telephone Encounter (Signed)
No spotting is fine. That's a good sign medicine is helping. We are waiting for final pathology report as I previously explained to her.

## 2014-06-05 ENCOUNTER — Telehealth: Payer: Self-pay | Admitting: Gynecology

## 2014-06-05 ENCOUNTER — Telehealth: Payer: Self-pay | Admitting: *Deleted

## 2014-06-05 NOTE — Telephone Encounter (Signed)
Stanton Kidney called back and appointment is scheduled on 06/11/14 @ 10:30am at [facility] with Dr.Rossi, pt will have pelvic exam that visit. I left message for pt to call.

## 2014-06-05 NOTE — Telephone Encounter (Signed)
Pt informed with the below note. 

## 2014-06-05 NOTE — Telephone Encounter (Signed)
This is a telephone conversation with the Stacey Arroyo to discuss her confirmatory pathology report from her endometrial biopsy done recently. I received a telephone call from the pathologist Dr. Tillman Arroyo who had inform me that a second opinion on the pathology slides were reviewed by Dr. 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 We are going to her for her to Dr. Denman Arroyo GYN oncologist from [facility] at [facility] for assistance in management of this patient who will need a staging procedure at time of hysterectomy. Patient will continue her Megace 40 mg 4 times a day to help with her bleeding which has seemed to have subsided.

## 2014-06-05 NOTE — Telephone Encounter (Signed)
-----   Message from Terrance Mass, MD sent at 06/05/2014  9:14 AM EDT -----  Anderson Malta this is the patient that I was talking to  about. Please schedule an appointment with Dr. Denman George GYN oncologist from [facility] at [facility]. 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 I have inform patient that he will be calling to schedule appointment for next week.

## 2014-06-05 NOTE — Telephone Encounter (Signed)
Left message for Stacey Arroyo to call from oncology to have the below scheduled for patient.

## 2014-06-10 ENCOUNTER — Encounter: Payer: Self-pay | Admitting: Gynecologic Oncology

## 2014-06-10 ENCOUNTER — Ambulatory Visit: Payer: BLUE CROSS/BLUE SHIELD | Attending: Gynecologic Oncology | Admitting: Gynecologic Oncology

## 2014-06-10 VITALS — BP 151/81 | HR 87 | Temp 98.2°F | Resp 16 | Ht 68.0 in | Wt 303.0 lb

## 2014-06-10 DIAGNOSIS — C541 Malignant neoplasm of endometrium: Secondary | ICD-10-CM | POA: Diagnosis present

## 2014-06-10 DIAGNOSIS — Z6841 Body Mass Index (BMI) 40.0 and over, adult: Secondary | ICD-10-CM | POA: Diagnosis not present

## 2014-06-10 NOTE — Progress Notes (Signed)
Consult Note: Gyn-Onc  Consult was requested by Dr. Toney Rakes for the evaluation of Stacey Arroyo 39 y.o. female with grade 1 endometrioid endometrial cancer  CC:  Chief Complaint  Patient presents with  . Endometrial Carcinoma    Assessment/Plan:  Stacey Arroyo  is a 39 y.o.  year old with grade 1 endometrial cancer in the setting of morbid obesity.    A detailed discussion was held with the patient and her family with regard to to her endometrial cancer diagnosis. We discussed the standard management options for uterine cancer which includes surgery followed possibly by adjuvant therapy depending on the results of surgery. The options for surgical management include a hysterectomy and removal of the tubes and ovaries possibly with removal of pelvic and para-aortic lymph nodes. A minimally invasive approach including a robotic hysterectomy or laparoscopic hysterectomy have benefits including shorter hospital stay, recovery time and better wound healing. The alternative approach is an open hysterectomy. The patient has been counseled about these surgical options and the risks of surgery in general including infection, bleeding, damage to surrounding structures (including bowel, bladder, ureters, nerves or vessels), and the postoperative risks of PE/ DVT, and lymphedema. I extensively reviewed the additional risks of robotic hysterectomy including possible need for conversion to open laparotomy.  I discussed sentinel lymph node biopsy and the potential for decreased morbidity and false negatives. I discussed positioning during surgery of trendelenberg and risks of minor facial swelling and care we take in preoperative positioning.   I discussed alternatives to surgery including fertility sparing options such as hormonal therapy with progestins (associated with an 11% rate of reversal of the process). After counseling and consideration of her options, she desires to proceed with robotic  hysterectomy, BSO, sentinel lymph node biopsy. She has completed childbearing. We will remove ovaries for completeness of staging, however, provided this is an early stage cancer, I recommend HRT until at least age of natural menopause (approximately 39 years of age). I discussed that estrogen replacement therapy has not been associated with increased risk for recurrence of cancer or breast cancer.  She will be seen by anesthesia for preoperative clearance and discussion of postoperative pain management.  She was given the opportunity to ask questions, which were answered to her satisfaction, and she is agreement with the above mentioned plan of care.    HPI: Stacey Arroyo is a 39 year old G2 P2 who is seen in consultation at the request of Dr. Toney Rakes for a grade 1 endometrial cancer. The patient is morbidly obese with a BMI of 46 kg/m. 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 She's had abnormal menstruation for at least 2 years. She is currently being treated with Megace 40 mg twice a day. As part of the workup for abnormal uterine bleeding she underwent an ultrasound scan on 05/29/2014 which revealed a uterus measuring 9.7 x 7.1 x 5.3 cm with a 13 mm endometrial stripe. An endometrial biopsy was performed on the same day revealed FIGO grade 1 endometrial adenocarcinoma.  She has had 2 prior cesarean sections but no other abdominal surgeries. Her knee replacement was in December 2015.    Current Meds:  Outpatient Encounter Prescriptions as of 06/10/2014  Medication Sig  . gabapentin (NEURONTIN) 300 MG capsule Take 600 mg by mouth.  Marland Kitchen lisinopril (PRINIVIL,ZESTRIL) 20 MG tablet TAKE 1 TABLET (20 MG TOTAL) BY MOUTH DAILY.  . megestrol (MEGACE) 40 MG tablet Take 1 tablet (40 mg total) by mouth 4 (  four) times daily.  . meloxicam (MOBIC) 15 MG tablet   . Oxycodone HCl 10 MG TABS   . topiramate (TOPAMAX) 200 MG tablet Take 1 tablet by mouth 2 (two) times daily.  .  [DISCONTINUED] oxyCODONE-acetaminophen (PERCOCET) 7.5-325 MG per tablet Take 1 tablet by mouth.  . sennosides-docusate sodium (SENOKOT-S) 8.6-50 MG tablet Take 1 tablet by mouth.  . [DISCONTINUED] cyclobenzaprine (FLEXERIL) 10 MG tablet   . [DISCONTINUED] cyclobenzaprine (FLEXERIL) 10 MG tablet   . [DISCONTINUED] enoxaparin (LOVENOX) 40 MG/0.4ML injection   . [DISCONTINUED] fluconazole (DIFLUCAN) 150 MG tablet Take 1 tablet (150 mg total) by mouth once.  . [DISCONTINUED] gabapentin (NEURONTIN) 300 MG capsule   . [DISCONTINUED] indomethacin (INDOCIN) 50 MG capsule Take 50 mg by mouth 2 (two) times daily.  . [DISCONTINUED] lisinopril (PRINIVIL,ZESTRIL) 10 MG tablet   . [DISCONTINUED] lisinopril (PRINIVIL,ZESTRIL) 20 MG tablet   . [DISCONTINUED] megestrol (MEGACE) 40 MG tablet Take 1 tablet (40 mg total) by mouth 3 (three) times daily.  . [DISCONTINUED] meloxicam (MOBIC) 15 MG tablet   . [DISCONTINUED] oxyCODONE (OXY IR/ROXICODONE) 5 MG immediate release tablet   . [DISCONTINUED] OXYCONTIN 20 MG T12A 12 hr tablet Take 20 mg by mouth every 12 (twelve) hours.    Allergy: No Known Allergies  Social Hx:   History   Social History  . Marital Status: Married    Spouse Name: N/A  . Number of Children: N/A  . Years of Education: N/A   Occupational History  . Not on file.   Social History Main Topics  . Smoking status: Former Research scientist (life sciences)  . Smokeless tobacco: Not on file  . Alcohol Use: No  . Drug Use: No  . Sexual Activity: Yes    Birth Control/ Protection: Condom   Other Topics Concern  . Not on file   Social History Narrative    Past Surgical Hx:  Past Surgical History  Procedure Laterality Date  . Knee surgery  2013  . Breast reduction surgery      Past Medical Hx:  Past Medical History  Diagnosis Date  . Osteoarthritis     Past Gynecological History:  Cesarean section x 2  No LMP recorded.  Family Hx:  Family History  Problem Relation Age of Onset  . Diabetes Father      Review of Systems:  Constitutional  Feels well,    ENT Normal appearing ears and nares bilaterally Skin/Breast  No rash, sores, jaundice, itching, dryness Cardiovascular  No chest pain, shortness of breath, or edema  Pulmonary  No cough or wheeze.  Gastro Intestinal  No nausea, vomitting, or diarrhoea. No bright red blood per rectum, no abdominal pain, change in bowel movement, or constipation.  Genito Urinary  No frequency, urgency, dysuria, see HPI Musculo Skeletal  No myalgia, arthralgia, joint swelling or pain  Neurologic  No weakness, numbness, change in gait,  Psychology  No depression, anxiety, insomnia.   Vitals:  Blood pressure 151/81, pulse 87, temperature 98.2 F (36.8 C), temperature source Oral, resp. rate 16, height 5\' 8"  (1.727 m), weight 303 lb (137.44 kg).  Physical Exam: WD in NAD Neck  Supple NROM, without any enlargements.  Lymph Node Survey No cervical supraclavicular or inguinal adenopathy Cardiovascular  Pulse normal rate, regularity and rhythm. S1 and S2 normal.  Lungs  Clear to auscultation bilateraly, without wheezes/crackles/rhonchi. Good air movement.  Skin  No rash/lesions/breakdown  Psychiatry  Alert and oriented to person, place, and time  Abdomen  Normoactive bowel sounds, abdomen soft,  non-tender and obese without evidence of hernia.  Back No CVA tenderness Genito Urinary  Vulva/vagina: Normal external female genitalia.  No lesions. No discharge or bleeding.  Bladder/urethra:  No lesions or masses, well supported bladder  Vagina: normal no lesions  Cervix: Normal appearing, no lesions.  Uterus: Small, mobile, no parametrial involvement or nodularity.  Adnexa: no palpable masses. Rectal  Good tone, no masses no cul de sac nodularity.  Extremities  No bilateral cyanosis, clubbing or edema.   [name], MD   06/10/2014, 2:23 PM

## 2014-06-10 NOTE — Progress Notes (Signed)
Please put orders in Epic surgery 06-23-14 pre op 06-17-14 Thanks

## 2014-06-10 NOTE — Patient Instructions (Addendum)
Preparing for your Surgery  Plan for surgery on April 5 with Dr. Denman George.  Pre-operative Testing -You will receive a phone call from presurgical testing at [facility] to arrange for a pre-operative testing appointment before your surgery.  This appointment normally occurs one to two weeks before your scheduled surgery.   -Bring your insurance card, copy of an advanced directive if applicable, medication list  -At that visit, you will be asked to sign a consent for a possible blood transfusion in case a transfusion becomes necessary during surgery.  The need for a blood transfusion is rare but having consent is a necessary part of your care.     -You should not be taking blood thinners or aspirin at least ten days prior to surgery unless instructed by your surgeon.  Day Before Surgery at [place] will be asked to take in only clear liquids the day before surgery.  Examples of clear liquids include broths, jello, and clear juices. You will be advised to have nothing to eat or drink after midnight the evening before.    Your role in recovery Your role is to become active as soon as directed by your doctor, while still giving yourself time to heal.  Rest when you feel tired. You will be asked to do the following in order to speed your recovery:  - Cough and breathe deeply. This helps toclear and expand your lungs and can prevent pneumonia. You may be given a spirometer to practice deep breathing. A staff member will show you how to use the spirometer. - Do mild physical activity. Walking or moving your legs help your circulation and body functions return to normal. A staff member will help you when you try to walk and will provide you with simple exercises. Do not try to get up or walk alone the first time. - Actively manage your pain. Managing your pain lets you move in comfort. We will ask you to rate your pain on a scale of zero to 10. It is your responsibility to tell your  doctor or nurse where and how much you hurt so your pain can be treated.  Special Considerations -If you are diabetic, you may be placed on insulin after surgery to have closer control over your blood sugars to promote healing and recovery.  This does not mean that you will be discharged on insulin.  If applicable, your oral antidiabetics will be resumed when you are tolerating a solid diet.  -Your final pathology results from surgery should be available by the Friday after surgery and the results will be relayed to you when available.  Blood Transfusion Information WHAT IS A BLOOD TRANSFUSION? A transfusion is the replacement of blood or some of its parts. Blood is made up of multiple cells which provide different functions.  Red blood cells carry oxygen and are used for blood loss replacement.  White blood cells fight against infection.  Platelets control bleeding.  Plasma helps clot blood.  Other blood products are available for specialized needs, such as hemophilia or other clotting disorders. BEFORE THE TRANSFUSION  Who gives blood for transfusions?   You may be able to donate blood to be used at a later date on yourself (autologous donation).  Relatives can be asked to donate blood. This is generally not any safer than if you have received blood from a stranger. The same precautions are taken to ensure safety when a relative's blood is donated.  Healthy volunteers who are fully evaluated  to make sure their blood is safe. This is blood bank blood. Transfusion therapy is the safest it has ever been in the practice of medicine. Before blood is taken from a donor, a complete history is taken to make sure that person has no history of diseases nor engages in risky social behavior (examples are intravenous drug use or sexual activity with multiple partners). The donor's travel history is screened to minimize risk of transmitting infections, such as malaria. The donated blood is tested for  signs of infectious diseases, such as HIV and hepatitis. The blood is then tested to be sure it is compatible with you in order to minimize the chance of a transfusion reaction. If you or a relative donates blood, this is often done in anticipation of surgery and is not appropriate for emergency situations. It takes many days to process the donated blood. RISKS AND COMPLICATIONS Although transfusion therapy is very safe and saves many lives, the main dangers of transfusion include:   Getting an infectious disease.  Developing a transfusion reaction. This is an allergic reaction to something in the blood you were given. Every precaution is taken to prevent this. The decision to have a blood transfusion has been considered carefully by your caregiver before blood is given. Blood is not given unless the benefits outweigh the risks.

## 2014-06-11 ENCOUNTER — Encounter (HOSPITAL_COMMUNITY): Payer: Self-pay

## 2014-06-16 NOTE — Patient Instructions (Signed)
Your procedure is scheduled on:  06/23/14  TUESDAY  Report to [facility] at [place]      AM.   Call this number if you have problems the morning of surgery: [place]  Do not  TAKE ANYTHING BY MOUTH After Midnight. Monday NIGHT.   Take these medicines the morning of surgery with A SIP OF WATER:Gabapentin, Topomax May take Oxycodone if needed   .  Contacts, dentures or partial plates, or metal hairpins  can not be worn to surgery. Your family will be responsible for glasses, dentures, hearing aides while you are in surgery  Leave suitcase in the car. After surgery it may be brought to your room.  For patients admitted to the hospital, checkout time is 11:00 AM day of  discharge.         [facility] IS NOT RESPONSIBLE FOR ANY VALUABLES  Patients discharged the day of surgery will not be allowed to drive home. IF going home the day of surgery, you must have a driver and someone to stay with you for the first 24 hours                                                                                                                                           [facility] - Preparing for Surgery Before surgery, you can play an important role.  Because skin is not sterile, your skin needs to be as free of germs as possible.  You can reduce the number of germs on your skin by washing with CHG (chlorahexidine gluconate) soap before surgery.  CHG is an antiseptic cleaner which kills germs and bonds with the skin to continue killing germs even after washing. Please DO NOT use if you have an allergy to CHG or antibacterial soaps.  If your skin becomes reddened/irritated stop using the CHG and inform your nurse when you arrive at Short Stay. Do not shave (including legs and underarms) for at least 48 hours prior to the first CHG shower.  You may shave your face/neck. Please follow these  instructions carefully:  1.  Shower with CHG Soap the night before surgery and the  morning of Surgery.  2.  If you choose to wash your hair, wash your hair first as usual with your  normal  shampoo.  3.  After you shampoo, rinse your hair and body thoroughly to remove the  shampoo.                           4.  Use CHG as you would any other liquid soap.  You can apply chg directly  to the skin and wash  Gently with a scrungie or clean washcloth.  5.  Apply the CHG Soap to your body ONLY FROM THE NECK DOWN.   Do not use on face/ open                           Wound or open sores. Avoid contact with eyes, ears mouth and genitals (private parts).                       Wash face,  Genitals (private parts) with your normal soap.             6.  Wash thoroughly, paying special attention to the area where your surgery  will be performed.  7.  Thoroughly rinse your body with warm water from the neck down.  8.  DO NOT shower/wash with your normal soap after using and rinsing off  the CHG Soap.                9.  Pat yourself dry with a clean towel.            10.  Wear clean pajamas.            11.  Place clean sheets on your bed the night of your first shower and do not  sleep with pets. Day of Surgery : Do not apply any lotions/deodorants the morning of surgery.  Please wear clean clothes to the hospital/surgery center.  FAILURE TO FOLLOW THESE INSTRUCTIONS MAY RESULT IN THE CANCELLATION OF YOUR SURGERY PATIENT SIGNATURE_________________________________  NURSE SIGNATURE__________________________________  ________________________________________________________________________  WHAT IS A BLOOD TRANSFUSION? Blood Transfusion Information  A transfusion is the replacement of blood or some of its parts. Blood is made up of multiple cells which provide different functions.  Red blood cells carry oxygen and are used for blood loss replacement.  White blood cells fight against  infection.  Platelets control bleeding.  Plasma helps clot blood.  Other blood products are available for specialized needs, such as hemophilia or other clotting disorders. BEFORE THE TRANSFUSION  Who gives blood for transfusions?   Healthy volunteers who are fully evaluated to make sure their blood is safe. This is blood bank blood. Transfusion therapy is the safest it has ever been in the practice of medicine. Before blood is taken from a donor, a complete history is taken to make sure that person has no history of diseases nor engages in risky social behavior (examples are intravenous drug use or sexual activity with multiple partners). The donor's travel history is screened to minimize risk of transmitting infections, such as malaria. The donated blood is tested for signs of infectious diseases, such as HIV and hepatitis. The blood is then tested to be sure it is compatible with you in order to minimize the chance of a transfusion reaction. If you or a relative donates blood, this is often done in anticipation of surgery and is not appropriate for emergency situations. It takes many days to process the donated blood. RISKS AND COMPLICATIONS Although transfusion therapy is very safe and saves many lives, the main dangers of transfusion include:  1. Getting an infectious disease. 2. Developing a transfusion reaction. This is an allergic reaction to something in the blood you were given. Every precaution is taken to prevent this. The decision to have a blood transfusion has been considered carefully by your caregiver before blood is given. Blood is not given unless the benefits outweigh  the risks. AFTER THE TRANSFUSION  Right after receiving a blood transfusion, you will usually feel much better and more energetic. This is especially true if your red blood cells have gotten low (anemic). The transfusion raises the level of the red blood cells which carry oxygen, and this usually causes an energy  increase.  The nurse administering the transfusion will monitor you carefully for complications. HOME CARE INSTRUCTIONS  No special instructions are needed after a transfusion. You may find your energy is better. Speak with your caregiver about any limitations on activity for underlying diseases you may have. SEEK MEDICAL CARE IF:   Your condition is not improving after your transfusion.  You develop redness or irritation at the intravenous (IV) site. SEEK IMMEDIATE MEDICAL CARE IF:  Any of the following symptoms occur over the next 12 hours:  Shaking chills.  You have a temperature by mouth above 102 F (38.9 C), not controlled by medicine.  Chest, back, or muscle pain.  People around you feel you are not acting correctly or are confused.  Shortness of breath or difficulty breathing.  Dizziness and fainting.  You get a rash or develop hives.  You have a decrease in urine output.  Your urine turns a dark color or changes to pink, red, or brown. Any of the following symptoms occur over the next 10 days:  You have a temperature by mouth above 102 F (38.9 C), not controlled by medicine.  Shortness of breath.  Weakness after normal activity.  The white part of the eye turns yellow (jaundice).  You have a decrease in the amount of urine or are urinating less often.  Your urine turns a dark color or changes to pink, red, or brown. Document Released: 03/03/2000 Document Revised: 05/29/2011 Document Reviewed: 10/21/2007 ExitCare Patient Information 2014 [place].  _______________________________________________________________________  Incentive Spirometer  An incentive spirometer is a tool that can help keep your lungs clear and active. This tool measures how well you are filling your lungs with each breath. Taking long deep breaths may help reverse or decrease the chance of developing breathing (pulmonary) problems (especially infection) following:  A long  period of time when you are unable to move or be active. BEFORE THE PROCEDURE   If the spirometer includes an indicator to show your best effort, your nurse or respiratory therapist will set it to a desired goal.  If possible, sit up straight or lean slightly forward. Try not to slouch.  Hold the incentive spirometer in an upright position. INSTRUCTIONS FOR USE  3. Sit on the edge of your bed if possible, or sit up as far as you can in bed or on a chair. 4. Hold the incentive spirometer in an upright position. 5. Breathe out normally. 6. Place the mouthpiece in your mouth and seal your lips tightly around it. 7. Breathe in slowly and as deeply as possible, raising the piston or the ball toward the top of the column. 8. Hold your breath for 3-5 seconds or for as long as possible. Allow the piston or ball to fall to the bottom of the column. 9. Remove the mouthpiece from your mouth and breathe out normally. 10. Rest for a few seconds and repeat Steps 1 through 7 at least 10 times every 1-2 hours when you are awake. Take your time and take a few normal breaths between deep breaths. 11. The spirometer may include an indicator to show your best effort. Use the indicator as a goal to work  toward during each repetition. 12. After each set of 10 deep breaths, practice coughing to be sure your lungs are clear. If you have an incision (the cut made at the time of surgery), support your incision when coughing by placing a pillow or rolled up towels firmly against it. Once you are able to get out of bed, walk around indoors and cough well. You may stop using the incentive spirometer when instructed by your caregiver.  RISKS AND COMPLICATIONS  Take your time so you do not get dizzy or light-headed.  If you are in pain, you may need to take or ask for pain medication before doing incentive spirometry. It is harder to take a deep breath if you are having pain. AFTER USE  Rest and breathe slowly and  easily.  It can be helpful to keep track of a log of your progress. Your caregiver can provide you with a simple table to help with this. If you are using the spirometer at home, follow these instructions: [place] IF:   You are having difficultly using the spirometer.  You have trouble using the spirometer as often as instructed.  Your pain medication is not giving enough relief while using the spirometer.  You develop fever of 100.5 F (38.1 C) or higher. SEEK IMMEDIATE MEDICAL CARE IF:   You cough up bloody sputum that had not been present before.  You develop fever of 102 F (38.9 C) or greater.  You develop worsening pain at or near the incision site. MAKE SURE YOU:   Understand these instructions.  Will watch your condition.  Will get help right away if you are not doing well or get worse. Document Released: 07/17/2006 Document Revised: 05/29/2011 Document Reviewed: 09/17/2006 ExitCare Patient Information 2014 ExitCare, [place].   ________________________________________________________________________    CLEAR LIQUID DIET   Foods Allowed                                                                     Foods Excluded  Coffee and tea, regular and decaf                             liquids that you cannot  Plain Jell-O in any flavor                                             see through such as: Fruit ices (not with fruit pulp)                                     milk, soups, orange juice  Iced Popsicles                                    All solid food Carbonated beverages, regular and diet  Cranberry, grape and apple juices Sports drinks like Gatorade Lightly seasoned clear broth or consume(fat free) Sugar, honey syrup  Sample Menu Breakfast                                Lunch                                     Supper Cranberry juice                    Beef broth                            Chicken broth Jell-O                                      Grape juice                           Apple juice Coffee or tea                        Jell-O                                      Popsicle                                                Coffee or tea                        Coffee or tea  _____________________________________________________________________

## 2014-06-17 ENCOUNTER — Encounter (HOSPITAL_COMMUNITY)
Admission: RE | Admit: 2014-06-17 | Discharge: 2014-06-17 | Disposition: A | Discharge status: Home / Self Care | Payer: BLUE CROSS/BLUE SHIELD | Source: Ambulatory Visit | Attending: Gynecologic Oncology | Admitting: Gynecologic Oncology

## 2014-06-17 ENCOUNTER — Encounter (HOSPITAL_COMMUNITY): Payer: Self-pay

## 2014-06-17 ENCOUNTER — Ambulatory Visit (HOSPITAL_COMMUNITY)
Admission: RE | Admit: 2014-06-17 | Discharge: 2014-06-17 | Disposition: A | Discharge status: Home / Self Care | Payer: BLUE CROSS/BLUE SHIELD | Source: Ambulatory Visit | Attending: Gynecologic Oncology | Admitting: Gynecologic Oncology

## 2014-06-17 DIAGNOSIS — I10 Essential (primary) hypertension: Secondary | ICD-10-CM | POA: Diagnosis not present

## 2014-06-17 DIAGNOSIS — Z01818 Encounter for other preprocedural examination: Secondary | ICD-10-CM | POA: Insufficient documentation

## 2014-06-17 DIAGNOSIS — C541 Malignant neoplasm of endometrium: Secondary | ICD-10-CM | POA: Diagnosis not present

## 2014-06-17 HISTORY — DX: Malignant (primary) neoplasm, unspecified: C80.1

## 2014-06-17 LAB — CBC WITH DIFFERENTIAL/PLATELET
Basophils Absolute: 0.1 10*3/uL (ref 0.0–0.1)
Basophils Relative: 1 % (ref 0–1)
Eosinophils Absolute: 0.2 10*3/uL (ref 0.0–0.7)
Eosinophils Relative: 3 % (ref 0–5)
HCT: 34 % — ABNORMAL LOW (ref 36.0–46.0)
Hemoglobin: 10.6 g/dL — ABNORMAL LOW (ref 12.0–15.0)
Lymphocytes Relative: 37 % (ref 12–46)
Lymphs Abs: 3.1 10*3/uL (ref 0.7–4.0)
MCH: 25.9 pg — ABNORMAL LOW (ref 26.0–34.0)
MCHC: 31.2 g/dL (ref 30.0–36.0)
MCV: 83.1 fL (ref 78.0–100.0)
MONO ABS: 0.5 10*3/uL (ref 0.1–1.0)
Monocytes Relative: 6 % (ref 3–12)
NEUTROS ABS: 4.5 10*3/uL (ref 1.7–7.7)
Neutrophils Relative %: 53 % (ref 43–77)
Platelets: 346 10*3/uL (ref 150–400)
RBC: 4.09 MIL/uL (ref 3.87–5.11)
RDW: 14.2 % (ref 11.5–15.5)
WBC: 8.4 10*3/uL (ref 4.0–10.5)

## 2014-06-17 LAB — COMPREHENSIVE METABOLIC PANEL WITH GFR
ALT: 18 U/L (ref 0–35)
AST: 17 U/L (ref 0–37)
Albumin: 4.3 g/dL (ref 3.5–5.2)
Alkaline Phosphatase: 65 U/L (ref 39–117)
Anion gap: 8 (ref 5–15)
BUN: 25 mg/dL — ABNORMAL HIGH (ref 6–23)
CO2: 19 mmol/L (ref 19–32)
Calcium: 8.9 mg/dL (ref 8.4–10.5)
Chloride: 110 mmol/L (ref 96–112)
Creatinine, Ser: 0.71 mg/dL (ref 0.50–1.10)
GFR calc Af Amer: >90 mL/min
GFR calc non Af Amer: >90 mL/min
Glucose, Bld: 95 mg/dL (ref 70–99)
Potassium: 4.1 mmol/L (ref 3.5–5.1)
Sodium: 137 mmol/L (ref 135–145)
Total Bilirubin: 0.3 mg/dL (ref 0.3–1.2)
Total Protein: 7.9 g/dL (ref 6.0–8.3)

## 2014-06-17 LAB — URINALYSIS, ROUTINE W REFLEX MICROSCOPIC
Bilirubin Urine: NEGATIVE
Glucose, UA: NEGATIVE mg/dL
Ketones, ur: NEGATIVE mg/dL
Nitrite: NEGATIVE
Protein, ur: NEGATIVE mg/dL
Specific Gravity, Urine: 1.028 (ref 1.005–1.030)
Urobilinogen, UA: 0.2 mg/dL (ref 0.0–1.0)
pH: 6 (ref 5.0–8.0)

## 2014-06-17 LAB — URINE MICROSCOPIC-ADD ON

## 2014-06-17 LAB — PREGNANCY, URINE: Preg Test, Ur: NEGATIVE

## 2014-06-17 LAB — ABO/RH: ABO/RH(D): O POS

## 2014-06-17 NOTE — Progress Notes (Signed)
ekg 11/15 chart

## 2014-06-17 NOTE — Progress Notes (Signed)
Faxed u/a with [facility], cmp to Heywood Iles NP via epic

## 2014-06-22 ENCOUNTER — Encounter (HOSPITAL_COMMUNITY): Payer: Self-pay | Admitting: Anesthesiology

## 2014-06-22 MED ORDER — DEXTROSE 5 % IV SOLN
3.0000 g | INTRAVENOUS | Status: AC
  Administered 2014-06-23: 3 g via INTRAVENOUS
  Filled 2014-06-22 (×2): qty 3000

## 2014-06-22 NOTE — Anesthesia Preprocedure Evaluation (Signed)
Anesthesia Evaluation  Patient identified by MRN, date of birth, ID band Patient awake    Reviewed: Allergy & Precautions, NPO status , Patient's Chart, lab work & pertinent test results  Airway Mallampati: II  TM Distance: >3 FB Neck ROM: Full    Dental no notable dental hx.    Pulmonary former smoker,  breath sounds clear to auscultation  Pulmonary exam normal       Cardiovascular negative cardio ROS  Rhythm:Regular Rate:Normal     Neuro/Psych negative neurological ROS  negative psych ROS   GI/Hepatic negative GI ROS, Neg liver ROS,   Endo/Other  Morbid obesity  Renal/GU negative Renal ROS  negative genitourinary   Musculoskeletal  (+) Arthritis -,   Abdominal (+) + obese,   Peds negative pediatric ROS (+)  Hematology negative hematology ROS (+)   Anesthesia Other Findings   Reproductive/Obstetrics negative OB ROS                             Anesthesia Physical Anesthesia Plan  ASA: III  Anesthesia Plan: General   Post-op Pain Management:    Induction: Intravenous  Airway Management Planned: Oral ETT  Additional Equipment:   Intra-op Plan:   Post-operative Plan: Extubation in OR  Informed Consent: I have reviewed the patients History and Physical, chart, labs and discussed the procedure including the risks, benefits and alternatives for the proposed anesthesia with the patient or authorized representative who has indicated his/her understanding and acceptance.   Dental advisory given  Plan Discussed with: CRNA  Anesthesia Plan Comments:         Anesthesia Quick Evaluation

## 2014-06-23 ENCOUNTER — Ambulatory Visit (HOSPITAL_COMMUNITY): Payer: BLUE CROSS/BLUE SHIELD | Admitting: Anesthesiology

## 2014-06-23 ENCOUNTER — Encounter (HOSPITAL_COMMUNITY)
Admission: RE | Disposition: A | Discharge status: Home / Self Care | Payer: Self-pay | Source: Ambulatory Visit | Attending: Gynecologic Oncology

## 2014-06-23 ENCOUNTER — Encounter (HOSPITAL_COMMUNITY): Payer: Self-pay

## 2014-06-23 ENCOUNTER — Ambulatory Visit (HOSPITAL_COMMUNITY)
Admission: RE | Admit: 2014-06-23 | Discharge: 2014-06-24 | Disposition: A | Discharge status: Home / Self Care | Payer: BLUE CROSS/BLUE SHIELD | Source: Ambulatory Visit | Attending: Gynecologic Oncology | Admitting: Gynecologic Oncology

## 2014-06-23 DIAGNOSIS — Z87891 Personal history of nicotine dependence: Secondary | ICD-10-CM | POA: Diagnosis not present

## 2014-06-23 DIAGNOSIS — M1990 Unspecified osteoarthritis, unspecified site: Secondary | ICD-10-CM | POA: Insufficient documentation

## 2014-06-23 DIAGNOSIS — N800 Endometriosis of uterus: Secondary | ICD-10-CM | POA: Diagnosis not present

## 2014-06-23 DIAGNOSIS — C541 Malignant neoplasm of endometrium: Secondary | ICD-10-CM | POA: Diagnosis present

## 2014-06-23 DIAGNOSIS — Z6841 Body Mass Index (BMI) 40.0 and over, adult: Secondary | ICD-10-CM | POA: Diagnosis not present

## 2014-06-23 HISTORY — PX: ROBOTIC ASSISTED TOTAL HYSTERECTOMY WITH BILATERAL SALPINGO OOPHERECTOMY: SHX6086

## 2014-06-23 LAB — TYPE AND SCREEN
ABO/RH(D): O POS
Antibody Screen: NEGATIVE

## 2014-06-23 SURGERY — ROBOTIC ASSISTED TOTAL HYSTERECTOMY WITH BILATERAL SALPINGO OOPHORECTOMY
Anesthesia: General

## 2014-06-23 MED ORDER — KCL IN DEXTROSE-NACL 20-5-0.45 MEQ/L-%-% IV SOLN
INTRAVENOUS | Status: DC
  Administered 2014-06-23 – 2014-06-24 (×2): via INTRAVENOUS
  Filled 2014-06-23 (×3): qty 1000

## 2014-06-23 MED ORDER — ONDANSETRON HCL 4 MG/2ML IJ SOLN
INTRAMUSCULAR | Status: DC | PRN
  Administered 2014-06-23: 4 mg via INTRAVENOUS

## 2014-06-23 MED ORDER — HYDROMORPHONE HCL 1 MG/ML IJ SOLN
0.5000 mg | Freq: Once | INTRAMUSCULAR | Status: AC
  Administered 2014-06-23: 0.5 mg via INTRAVENOUS

## 2014-06-23 MED ORDER — PROPOFOL 10 MG/ML IV BOLUS
INTRAVENOUS | Status: AC
  Filled 2014-06-23: qty 20

## 2014-06-23 MED ORDER — HYDROMORPHONE HCL 1 MG/ML IJ SOLN
0.2000 mg | INTRAMUSCULAR | Status: AC | PRN
  Administered 2014-06-23 (×2): 0.5 mg via INTRAVENOUS
  Filled 2014-06-23 (×2): qty 1

## 2014-06-23 MED ORDER — FENTANYL CITRATE 0.05 MG/ML IJ SOLN
INTRAMUSCULAR | Status: DC | PRN
  Administered 2014-06-23 (×3): 50 ug via INTRAVENOUS
  Administered 2014-06-23: 100 ug via INTRAVENOUS

## 2014-06-23 MED ORDER — IBUPROFEN 800 MG PO TABS
800.0000 mg | ORAL_TABLET | Freq: Three times a day (TID) | ORAL | Status: DC | PRN
  Administered 2014-06-23: 800 mg via ORAL
  Filled 2014-06-23: qty 1

## 2014-06-23 MED ORDER — MIDAZOLAM HCL 2 MG/2ML IJ SOLN
INTRAMUSCULAR | Status: AC
  Filled 2014-06-23: qty 2

## 2014-06-23 MED ORDER — HYDROMORPHONE HCL 1 MG/ML IJ SOLN
0.2500 mg | INTRAMUSCULAR | Status: DC | PRN
  Administered 2014-06-23: 0.5 mg via INTRAVENOUS

## 2014-06-23 MED ORDER — PROPOFOL 10 MG/ML IV BOLUS
INTRAVENOUS | Status: DC | PRN
  Administered 2014-06-23: 150 mg via INTRAVENOUS

## 2014-06-23 MED ORDER — OXYCODONE-ACETAMINOPHEN 5-325 MG PO TABS
2.0000 | ORAL_TABLET | Freq: Four times a day (QID) | ORAL | Status: DC | PRN
  Administered 2014-06-23: 2 via ORAL
  Filled 2014-06-23: qty 2

## 2014-06-23 MED ORDER — STERILE WATER FOR IRRIGATION IR SOLN
Status: DC | PRN
  Administered 2014-06-23: 3000 mL

## 2014-06-23 MED ORDER — METOCLOPRAMIDE HCL 5 MG/ML IJ SOLN
INTRAMUSCULAR | Status: DC | PRN
  Administered 2014-06-23: 10 mg via INTRAVENOUS

## 2014-06-23 MED ORDER — ACETAMINOPHEN 500 MG PO TABS
1000.0000 mg | ORAL_TABLET | Freq: Four times a day (QID) | ORAL | Status: DC
  Administered 2014-06-23 – 2014-06-24 (×3): 1000 mg via ORAL
  Filled 2014-06-23 (×7): qty 2

## 2014-06-23 MED ORDER — FENTANYL CITRATE 0.05 MG/ML IJ SOLN
INTRAMUSCULAR | Status: AC
  Filled 2014-06-23: qty 5

## 2014-06-23 MED ORDER — SUCCINYLCHOLINE CHLORIDE 20 MG/ML IJ SOLN
INTRAMUSCULAR | Status: DC | PRN
  Administered 2014-06-23: 100 mg via INTRAVENOUS

## 2014-06-23 MED ORDER — GABAPENTIN 300 MG PO CAPS
300.0000 mg | ORAL_CAPSULE | Freq: Two times a day (BID) | ORAL | Status: DC
  Administered 2014-06-23 – 2014-06-24 (×2): 300 mg via ORAL
  Filled 2014-06-23 (×3): qty 1

## 2014-06-23 MED ORDER — ENOXAPARIN SODIUM 40 MG/0.4ML ~~LOC~~ SOLN
40.0000 mg | SUBCUTANEOUS | Status: AC
  Administered 2014-06-23: 40 mg via SUBCUTANEOUS
  Filled 2014-06-23: qty 0.4

## 2014-06-23 MED ORDER — ACETAMINOPHEN 10 MG/ML IV SOLN
1000.0000 mg | Freq: Once | INTRAVENOUS | Status: AC
  Administered 2014-06-23: 1000 mg via INTRAVENOUS
  Filled 2014-06-23: qty 100

## 2014-06-23 MED ORDER — LACTATED RINGERS IV SOLN: INTRAVENOUS | Status: DC

## 2014-06-23 MED ORDER — HYDROMORPHONE HCL 1 MG/ML IJ SOLN
INTRAMUSCULAR | Status: AC
  Filled 2014-06-23: qty 1

## 2014-06-23 MED ORDER — PHENYLEPHRINE HCL 10 MG/ML IJ SOLN
INTRAMUSCULAR | Status: AC
  Filled 2014-06-23: qty 1

## 2014-06-23 MED ORDER — ENOXAPARIN SODIUM 40 MG/0.4ML ~~LOC~~ SOLN
40.0000 mg | SUBCUTANEOUS | Status: DC
  Administered 2014-06-24: 40 mg via SUBCUTANEOUS
  Filled 2014-06-23: qty 0.4

## 2014-06-23 MED ORDER — PHENYLEPHRINE HCL 10 MG/ML IJ SOLN
10.0000 mg | INTRAVENOUS | Status: DC | PRN
  Administered 2014-06-23: 20 ug/min via INTRAVENOUS

## 2014-06-23 MED ORDER — GLYCOPYRROLATE 0.2 MG/ML IJ SOLN
INTRAMUSCULAR | Status: DC | PRN
  Administered 2014-06-23: .8 mg via INTRAVENOUS

## 2014-06-23 MED ORDER — METOCLOPRAMIDE HCL 5 MG/ML IJ SOLN
INTRAMUSCULAR | Status: AC
  Filled 2014-06-23: qty 2

## 2014-06-23 MED ORDER — ONDANSETRON HCL 4 MG/2ML IJ SOLN
INTRAMUSCULAR | Status: AC
  Filled 2014-06-23: qty 2

## 2014-06-23 MED ORDER — NEOSTIGMINE METHYLSULFATE 10 MG/10ML IV SOLN
INTRAVENOUS | Status: AC
  Filled 2014-06-23: qty 1

## 2014-06-23 MED ORDER — CISATRACURIUM BESYLATE (PF) 10 MG/5ML IV SOLN
INTRAVENOUS | Status: DC | PRN
Start: 2014-06-23 — End: 2014-06-23
  Administered 2014-06-23: 4 mg via INTRAVENOUS
  Administered 2014-06-23: 7 mg via INTRAVENOUS
  Administered 2014-06-23: 1 mg via INTRAVENOUS
  Administered 2014-06-23: 4 mg via INTRAVENOUS

## 2014-06-23 MED ORDER — NEOSTIGMINE METHYLSULFATE 10 MG/10ML IV SOLN
INTRAVENOUS | Status: DC | PRN
  Administered 2014-06-23: 5 mg via INTRAVENOUS

## 2014-06-23 MED ORDER — DEXAMETHASONE SODIUM PHOSPHATE 10 MG/ML IJ SOLN
INTRAMUSCULAR | Status: DC | PRN
  Administered 2014-06-23: 10 mg via INTRAVENOUS

## 2014-06-23 MED ORDER — CISATRACURIUM BESYLATE 20 MG/10ML IV SOLN
INTRAVENOUS | Status: AC
  Filled 2014-06-23: qty 10

## 2014-06-23 MED ORDER — DEXAMETHASONE SODIUM PHOSPHATE 10 MG/ML IJ SOLN
INTRAMUSCULAR | Status: AC
  Filled 2014-06-23: qty 1

## 2014-06-23 MED ORDER — ONDANSETRON HCL 4 MG/2ML IJ SOLN: 4.0000 mg | Freq: Four times a day (QID) | INTRAMUSCULAR | Status: DC | PRN

## 2014-06-23 MED ORDER — OXYCODONE HCL 5 MG PO TABS
10.0000 mg | ORAL_TABLET | ORAL | Status: DC | PRN
  Administered 2014-06-23 – 2014-06-24 (×4): 10 mg via ORAL
  Filled 2014-06-23 (×4): qty 2

## 2014-06-23 MED ORDER — TOPIRAMATE 100 MG PO TABS
200.0000 mg | ORAL_TABLET | Freq: Two times a day (BID) | ORAL | Status: DC
  Administered 2014-06-23 – 2014-06-24 (×2): 200 mg via ORAL
  Filled 2014-06-23 (×3): qty 2

## 2014-06-23 MED ORDER — HYDROMORPHONE HCL 1 MG/ML IJ SOLN
0.2500 mg | INTRAMUSCULAR | Status: DC | PRN
  Administered 2014-06-23 (×5): 0.5 mg via INTRAVENOUS

## 2014-06-23 MED ORDER — LACTATED RINGERS IV SOLN
INTRAVENOUS | Status: DC | PRN
  Administered 2014-06-23 (×2): via INTRAVENOUS

## 2014-06-23 MED ORDER — LISINOPRIL 20 MG PO TABS
20.0000 mg | ORAL_TABLET | Freq: Every morning | ORAL | Status: DC
  Administered 2014-06-23 – 2014-06-24 (×2): 20 mg via ORAL
  Filled 2014-06-23 (×2): qty 1

## 2014-06-23 MED ORDER — GLYCOPYRROLATE 0.2 MG/ML IJ SOLN
INTRAMUSCULAR | Status: AC
  Filled 2014-06-23: qty 4

## 2014-06-23 MED ORDER — ONDANSETRON HCL 4 MG PO TABS: 4.0000 mg | ORAL_TABLET | Freq: Four times a day (QID) | ORAL | Status: DC | PRN

## 2014-06-23 MED ORDER — MIDAZOLAM HCL 5 MG/5ML IJ SOLN
INTRAMUSCULAR | Status: DC | PRN
  Administered 2014-06-23: 2 mg via INTRAVENOUS

## 2014-06-23 MED ORDER — IBUPROFEN 800 MG PO TABS
800.0000 mg | ORAL_TABLET | Freq: Three times a day (TID) | ORAL | Status: DC
  Administered 2014-06-23 – 2014-06-24 (×2): 800 mg via ORAL
  Filled 2014-06-23 (×5): qty 1

## 2014-06-23 SURGICAL SUPPLY — 48 items
BAG SPEC RTRVL LRG 6X4 10 (ENDOMECHANICALS) ×1
CABLE HIGH FREQUENCY MONO STRZ (ELECTRODE) ×2 IMPLANT
CHLORAPREP W/TINT 26ML (MISCELLANEOUS) ×3 IMPLANT
CORDS BIPOLAR (ELECTRODE) ×3 IMPLANT
COVER SURGICAL LIGHT HANDLE (MISCELLANEOUS) ×2 IMPLANT
COVER TIP SHEARS 8 DVNC (MISCELLANEOUS) ×1 IMPLANT
COVER TIP SHEARS 8MM DA VINCI (MISCELLANEOUS) ×1
DRAPE SHEET LG 3/4 BI-LAMINATE (DRAPES) ×4 IMPLANT
DRAPE SURG IRRIG POUCH 19X23 (DRAPES) ×2 IMPLANT
DRAPE TABLE BACK 44X90 PK DISP (DRAPES) ×4 IMPLANT
DRAPE WARM FLUID 44X44 (DRAPE) ×2 IMPLANT
DRSG TEGADERM 6X8 (GAUZE/BANDAGES/DRESSINGS) ×3 IMPLANT
ELECT REM PT RETURN 9FT ADLT (ELECTROSURGICAL) ×2
ELECTRODE REM PT RTRN 9FT ADLT (ELECTROSURGICAL) ×1 IMPLANT
GLOVE BIO SURGEON STRL SZ 6 (GLOVE) ×6 IMPLANT
GLOVE BIO SURGEON STRL SZ 6.5 (GLOVE) ×4 IMPLANT
GOWN STRL REUS W/ TWL LRG LVL3 (GOWN DISPOSABLE) ×3 IMPLANT
GOWN STRL REUS W/TWL LRG LVL3 (GOWN DISPOSABLE) ×6
HOLDER FOLEY CATH W/STRAP (MISCELLANEOUS) ×2 IMPLANT
KIT ACCESSORY DA VINCI DISP (KITS) ×1
KIT ACCESSORY DVNC DISP (KITS) ×1 IMPLANT
KIT BASIN OR (CUSTOM PROCEDURE TRAY) ×2 IMPLANT
KIT PROCEDURE DA VINCI SI (MISCELLANEOUS) ×1
KIT PROCEDURE DVNC SI (MISCELLANEOUS) IMPLANT
LIQUID BAND (GAUZE/BANDAGES/DRESSINGS) ×2 IMPLANT
MANIPULATOR UTERINE 4.5 ZUMI (MISCELLANEOUS) ×2 IMPLANT
OCCLUDER COLPOPNEUMO (BALLOONS) ×2 IMPLANT
PEN SKIN MARKING BROAD (MISCELLANEOUS) ×2 IMPLANT
POUCH SPECIMEN RETRIEVAL 10MM (ENDOMECHANICALS) ×1 IMPLANT
SET TRI-LUMEN FLTR TB AIRSEAL (TUBING) ×1 IMPLANT
SET TUBE IRRIG SUCTION NO TIP (IRRIGATION / IRRIGATOR) ×2 IMPLANT
SHEET LAVH (DRAPES) ×2 IMPLANT
SOLUTION ELECTROLUBE (MISCELLANEOUS) ×2 IMPLANT
SUT VIC AB 0 CT1 27 (SUTURE) ×2
SUT VIC AB 0 CT1 27XBRD ANTBC (SUTURE) ×1 IMPLANT
SUT VIC AB 4-0 PS2 27 (SUTURE) ×4 IMPLANT
SUT VICRYL 0 UR6 27IN ABS (SUTURE) ×2 IMPLANT
SYR 50ML LL SCALE MARK (SYRINGE) ×2 IMPLANT
TOWEL OR 17X26 10 PK STRL BLUE (TOWEL DISPOSABLE) ×4 IMPLANT
TOWEL OR NON WOVEN STRL DISP B (DISPOSABLE) ×2 IMPLANT
TRAP SPECIMEN MUCOUS 40CC (MISCELLANEOUS) ×1 IMPLANT
TRAY FOLEY CATH 14FRSI W/METER (CATHETERS) ×2 IMPLANT
TRAY LAPAROSCOPIC (CUSTOM PROCEDURE TRAY) ×2 IMPLANT
TROCAR 12M 150ML BLUNT (TROCAR) ×2 IMPLANT
TROCAR BLADELESS OPT 5 100 (ENDOMECHANICALS) ×2 IMPLANT
TROCAR XCEL 12X100 BLDLESS (ENDOMECHANICALS) ×2 IMPLANT
TUBING INSUFFLATION 10FT LAP (TUBING) ×2 IMPLANT
WATER STERILE IRR 1500ML POUR (IV SOLUTION) ×4 IMPLANT

## 2014-06-23 NOTE — Addendum Note (Signed)
Addendum  created 06/23/14 2047 by Franne Grip, MD   Modules edited: Clinical Notes   Clinical Notes:  File: [id number]; Pend: [id number]; Pend: [id number]

## 2014-06-23 NOTE — H&P (View-Only) (Signed)
Consult Note: Gyn-Onc  Consult was requested by Dr. Toney Rakes for the evaluation of Stacey Arroyo 39 y.o. female with grade 1 endometrioid endometrial cancer  CC:  Chief Complaint  Patient presents with  . Endometrial Carcinoma    Assessment/Plan:  Ms. Stacey Arroyo  is a 39 y.o.  year old with grade 1 endometrial cancer in the setting of morbid obesity.    A detailed discussion was held with the patient and her family with regard to to her endometrial cancer diagnosis. We discussed the standard management options for uterine cancer which includes surgery followed possibly by adjuvant therapy depending on the results of surgery. The options for surgical management include a hysterectomy and removal of the tubes and ovaries possibly with removal of pelvic and para-aortic lymph nodes. A minimally invasive approach including a robotic hysterectomy or laparoscopic hysterectomy have benefits including shorter hospital stay, recovery time and better wound healing. The alternative approach is an open hysterectomy. The patient has been counseled about these surgical options and the risks of surgery in general including infection, bleeding, damage to surrounding structures (including bowel, bladder, ureters, nerves or vessels), and the postoperative risks of PE/ DVT, and lymphedema. I extensively reviewed the additional risks of robotic hysterectomy including possible need for conversion to open laparotomy.  I discussed sentinel lymph node biopsy and the potential for decreased morbidity and false negatives. I discussed positioning during surgery of trendelenberg and risks of minor facial swelling and care we take in preoperative positioning.   I discussed alternatives to surgery including fertility sparing options such as hormonal therapy with progestins (associated with an 11% rate of reversal of the process). After counseling and consideration of her options, she desires to proceed with robotic  hysterectomy, BSO, sentinel lymph node biopsy. She has completed childbearing. We will remove ovaries for completeness of staging, however, provided this is an early stage cancer, I recommend HRT until at least age of natural menopause (approximately 38 years of age). I discussed that estrogen replacement therapy has not been associated with increased risk for recurrence of cancer or breast cancer.  She will be seen by anesthesia for preoperative clearance and discussion of postoperative pain management.  She was given the opportunity to ask questions, which were answered to her satisfaction, and she is agreement with the above mentioned plan of care.    HPI: Stacey Arroyo is a 39 year old G2 P2 who is seen in consultation at the request of Dr. Toney Rakes for a grade 1 endometrial cancer. The patient is morbidly obese with a BMI of 46 kg/m. 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 She's had abnormal menstruation for at least 2 years. She is currently being treated with Megace 40 mg twice a day. As part of the workup for abnormal uterine bleeding she underwent an ultrasound scan on 05/29/2014 which revealed a uterus measuring 9.7 x 7.1 x 5.3 cm with a 13 mm endometrial stripe. An endometrial biopsy was performed on the same day revealed FIGO grade 1 endometrial adenocarcinoma.  She has had 2 prior cesarean sections but no other abdominal surgeries. Her knee replacement was in December 2015.    Current Meds:  Outpatient Encounter Prescriptions as of 06/10/2014  Medication Sig  . gabapentin (NEURONTIN) 300 MG capsule Take 600 mg by mouth.  Marland Kitchen lisinopril (PRINIVIL,ZESTRIL) 20 MG tablet TAKE 1 TABLET (20 MG TOTAL) BY MOUTH DAILY.  . megestrol (MEGACE) 40 MG tablet Take 1 tablet (40 mg total) by mouth 4 (  four) times daily.  . meloxicam (MOBIC) 15 MG tablet   . Oxycodone HCl 10 MG TABS   . topiramate (TOPAMAX) 200 MG tablet Take 1 tablet by mouth 2 (two) times daily.  .  [DISCONTINUED] oxyCODONE-acetaminophen (PERCOCET) 7.5-325 MG per tablet Take 1 tablet by mouth.  . sennosides-docusate sodium (SENOKOT-S) 8.6-50 MG tablet Take 1 tablet by mouth.  . [DISCONTINUED] cyclobenzaprine (FLEXERIL) 10 MG tablet   . [DISCONTINUED] cyclobenzaprine (FLEXERIL) 10 MG tablet   . [DISCONTINUED] enoxaparin (LOVENOX) 40 MG/0.4ML injection   . [DISCONTINUED] fluconazole (DIFLUCAN) 150 MG tablet Take 1 tablet (150 mg total) by mouth once.  . [DISCONTINUED] gabapentin (NEURONTIN) 300 MG capsule   . [DISCONTINUED] indomethacin (INDOCIN) 50 MG capsule Take 50 mg by mouth 2 (two) times daily.  . [DISCONTINUED] lisinopril (PRINIVIL,ZESTRIL) 10 MG tablet   . [DISCONTINUED] lisinopril (PRINIVIL,ZESTRIL) 20 MG tablet   . [DISCONTINUED] megestrol (MEGACE) 40 MG tablet Take 1 tablet (40 mg total) by mouth 3 (three) times daily.  . [DISCONTINUED] meloxicam (MOBIC) 15 MG tablet   . [DISCONTINUED] oxyCODONE (OXY IR/ROXICODONE) 5 MG immediate release tablet   . [DISCONTINUED] OXYCONTIN 20 MG T12A 12 hr tablet Take 20 mg by mouth every 12 (twelve) hours.    Allergy: No Known Allergies  Social Hx:   History   Social History  . Marital Status: Married    Spouse Name: N/A  . Number of Children: N/A  . Years of Education: N/A   Occupational History  . Not on file.   Social History Main Topics  . Smoking status: Former Research scientist (life sciences)  . Smokeless tobacco: Not on file  . Alcohol Use: No  . Drug Use: No  . Sexual Activity: Yes    Birth Control/ Protection: Condom   Other Topics Concern  . Not on file   Social History Narrative    Past Surgical Hx:  Past Surgical History  Procedure Laterality Date  . Knee surgery  2013  . Breast reduction surgery      Past Medical Hx:  Past Medical History  Diagnosis Date  . Osteoarthritis     Past Gynecological History:  Cesarean section x 2  No LMP recorded.  Family Hx:  Family History  Problem Relation Age of Onset  . Diabetes Father      Review of Systems:  Constitutional  Feels well,    ENT Normal appearing ears and nares bilaterally Skin/Breast  No rash, sores, jaundice, itching, dryness Cardiovascular  No chest pain, shortness of breath, or edema  Pulmonary  No cough or wheeze.  Gastro Intestinal  No nausea, vomitting, or diarrhoea. No bright red blood per rectum, no abdominal pain, change in bowel movement, or constipation.  Genito Urinary  No frequency, urgency, dysuria, see HPI Musculo Skeletal  No myalgia, arthralgia, joint swelling or pain  Neurologic  No weakness, numbness, change in gait,  Psychology  No depression, anxiety, insomnia.   Vitals:  Blood pressure 151/81, pulse 87, temperature 98.2 F (36.8 C), temperature source Oral, resp. rate 16, height 5\' 8"  (1.727 m), weight 303 lb (137.44 kg).  Physical Exam: WD in NAD Neck  Supple NROM, without any enlargements.  Lymph Node Survey No cervical supraclavicular or inguinal adenopathy Cardiovascular  Pulse normal rate, regularity and rhythm. S1 and S2 normal.  Lungs  Clear to auscultation bilateraly, without wheezes/crackles/rhonchi. Good air movement.  Skin  No rash/lesions/breakdown  Psychiatry  Alert and oriented to person, place, and time  Abdomen  Normoactive bowel sounds, abdomen soft,  non-tender and obese without evidence of hernia.  Back No CVA tenderness Genito Urinary  Vulva/vagina: Normal external female genitalia.  No lesions. No discharge or bleeding.  Bladder/urethra:  No lesions or masses, well supported bladder  Vagina: normal no lesions  Cervix: Normal appearing, no lesions.  Uterus: Small, mobile, no parametrial involvement or nodularity.  Adnexa: no palpable masses. Rectal  Good tone, no masses no cul de sac nodularity.  Extremities  No bilateral cyanosis, clubbing or edema.   [name], MD   06/10/2014, 2:23 PM

## 2014-06-23 NOTE — Interval H&P Note (Signed)
History and Physical Interval Note:  06/23/2014 7:16 AM  Stacey Arroyo  has presented today for surgery, with the diagnosis of endometrial cancer  The various methods of treatment have been discussed with the patient and family. After consideration of risks, benefits and other options for treatment, the patient has consented to  Procedure(s): ROBOTIC ASSISTED TOTAL HYSTERECTOMY WITH BILATERAL SALPINGO OOPHORECTOMY/SENTINAL LYMPHNODE BIOPSY (N/A) as a surgical intervention .  The patient's history has been reviewed, patient examined, no change in status, stable for surgery.  I have reviewed the patient's chart and labs.  Questions were answered to the patient's satisfaction.     [name]

## 2014-06-23 NOTE — Progress Notes (Signed)
Pt complaining about numbness in right hand up to wrist.  Pt states right hand has been numb since she woke up in recovery room after her surgery.  Pt states she feels hand numbness is not getting any better but not spreading up arm.  Dr. Denman George paged.  Joylene John, NP notified as well.  Will monitor right hand.  Nurse called OR per NP's request to see if anesthesiologist can come take a look at pt's hand. Pt is concerned.  No new orders at this time.

## 2014-06-23 NOTE — Anesthesia Procedure Notes (Signed)
Procedure Name: Intubation Date/Time: 06/23/2014 8:12 AM Performed by: Johnathan Hausen A Pre-anesthesia Checklist: Patient identified, Emergency Drugs available, Suction available, Patient being monitored and Timeout performed Patient Re-evaluated:Patient Re-evaluated prior to inductionOxygen Delivery Method: Circle system utilized Preoxygenation: Pre-oxygenation with 100% oxygen Intubation Type: Combination inhalational/ intravenous induction Ventilation: Mask ventilation without difficulty Laryngoscope Size: Mac and 4 Tube type: Oral Tube size: 7.5 mm Number of attempts: 1 Airway Equipment and Method: Stylet Placement Confirmation: ETT inserted through vocal cords under direct vision,  positive ETCO2 and breath sounds checked- equal and bilateral Secured at: 22 cm Tube secured with: Tape Dental Injury: Teeth and Oropharynx as per pre-operative assessment

## 2014-06-23 NOTE — Anesthesia Postprocedure Evaluation (Signed)
  Anesthesia Post-op Note  Patient: Stacey Arroyo  Procedure(s) Performed: Procedure(s) (LRB): ROBOTIC ASSISTED TOTAL HYSTERECTOMY WITH BILATERAL SALPINGO OOPHORECTOMY/SENTINAL LYMPHNODE LEFT PELVIC LYMPH BIOPSY (N/A)  Patient Location: PACU  Anesthesia Type: General  Level of Consciousness: awake and alert   Airway and Oxygen Therapy: Patient Spontanous Breathing  Post-op Pain: mild  Post-op Assessment: Post-op Vital signs reviewed, Patient's Cardiovascular Status Stable, Respiratory Function Stable, Patent Airway and No signs of Nausea or vomiting  Last Vitals:  Filed Vitals:   06/23/14 1315  BP: 123/71  Pulse: 78  Temp: 36.7 C  Resp: 18    Post-op Vital Signs: stable   Complications: No apparent anesthesia complications

## 2014-06-23 NOTE — Transfer of Care (Signed)
Immediate Anesthesia Transfer of Care Note  Patient: Stacey Arroyo  Procedure(s) Performed: Procedure(s): ROBOTIC ASSISTED TOTAL HYSTERECTOMY WITH BILATERAL SALPINGO OOPHORECTOMY/SENTINAL LYMPHNODE LEFT PELVIC LYMPH BIOPSY (N/A)  Patient Location: PACU  Anesthesia Type:General  Level of Consciousness: awake, alert , oriented and patient cooperative  Airway & Oxygen Therapy: Patient Spontanous Breathing and Patient connected to face mask oxygen  Post-op Assessment: Report given to RN and Post -op Vital signs reviewed and stable  Post vital signs: Reviewed and stable  Last Vitals:  Filed Vitals:   06/23/14 0526  BP: 121/67  Pulse: 90  Temp: 36.6 C  Resp: 18    Complications: No apparent anesthesia complications

## 2014-06-23 NOTE — Progress Notes (Signed)
I was called as patient is complaining of numbness of right hand since waking from anesthesia. She underwent approximately 2.75 hours of general anesthesia for robotic hysterectomy. Her arms were padded and tucked comfortably at her sides prior to initiating general anesthesia. No unusual intraoperative events noted. She does note she has had some nerve complaints in left leg following left knee replacement recently.  She has been up walking since surgery and she noted right shoulder pain at that time.  On examination, her right hand and forearm look normal and symmetric with her left upper extremity. She is able to open and close her right hand quickly. Strength in right bicep and triceps seems normal. She states her right hand feels clumsy and numb and slightly painful. Non red and non tender. States some numbness on anterior forearm but not posterior forearm. Strength of hand grip 4/5.  A/P: Probably neuropraxia of right upper extremity. Expect gradual resolution. Discussed referral to neurologist if symptoms persist or worsen. Will try warm compresses for symptomatic relief.

## 2014-06-23 NOTE — Op Note (Signed)
Surgeon: [name]   Assistants: Lahoma Crocker, MD.  (an MD assistant was necessary for tissue manipulation, management of robotic instrumentation, retraction and positioning due to the complexity of the case and hospital policies).   Anesthesia: General endotracheal anesthesia  ASA Class: 3   Pre-operative Diagnosis: grade 1 endometrial cancer  Post-operative Diagnosis: same  Operation: Robotic-assisted laparoscopic hysterectomy with bilateral salpingoophorectomy, sentinel lymph node biopsy, left pelvic lymphadenectomy  Surgeon: [name]  Assistant Surgeon: Lahoma Crocker MD  Anesthesia: GET  Urine Output: 400  Operative Findings:  : 12 week size otherwise normal uterus, dense adhesions between bladder and uterus, umbilical hernia, normal tubes and ovaries, no suspicious lymph nodes. Significant abdominal obesity and sigmoid colon redundancy (BMI >45kg/m2)  Estimated Blood Loss:  less than 100 mL             Specimens: uterus, cervix, bilateral tubes and ovaries, right obturator SLN, right external iliac SLN, left pelvic lymph nodes.         Complications:  None; patient tolerated the procedure well.         Disposition: PACU - hemodynamically stable.  Procedure Details  The patient was seen in the Holding Room. The risks, benefits, complications, treatment options, and expected outcomes were discussed with the patient.  The patient concurred with the proposed plan, giving informed consent.  The site of surgery properly noted/marked. The patient was identified as Stacey Arroyo and the procedure verified as a Robotic-assisted hysterectomy with bilateral salpingo oophorectomy. A Time Out was held and the above information confirmed.  After induction of anesthesia, the patient was draped and prepped in the usual sterile manner. Pt was placed in supine position after anesthesia and draped and prepped in the usual sterile manner. The abdominal drape  was placed after the CholoraPrep had been allowed to dry for 3 minutes.  Her arms were tucked to her side with all appropriate precautions.  The chest was secured to the table.  The patient was placed in the semi-lithotomy position in [place].  The perineum was prepped with Betadine.  Foley catheter was placed.  A sterile speculum was placed in the vagina.  The cervix was grasped with a single-tooth tenaculum. 1mg  total of ICG was injected into the cervical stroma at 2 and 9 o'clock at a 67mm depth (concentration 0..5mg /ml). The cervix was then dilated with [facility] dilators.  The ZUMI uterine manipulator with a medium colpotomizer ring was placed without difficulty.  A pneum occluder balloon was placed over the manipulator.  A second time-out was performed.  OG tube placement was confirmed and to suction.   Next, a 5 mm skin incision was made 1 cm below the subcostal margin in the midclavicular line.  The 5 mm Optiview port and scope was used for direct entry.  Opening pressure was under 10 mm CO2.  The abdomen was insufflated and the findings were noted as above.   At this point and all points during the procedure, the patient's intra-abdominal pressure did not exceed 15 mmHg. Next, a 10 mm skin incision was made 2cm above the umbilicus and a right and left port was placed about 10 cm lateral to the robot port on the right and left side.  A fourth arm was placed in the left lower quadrant 2 cm above and superior and medial to the anterior superior iliac spine.  All ports were placed under direct visualization.  The patient was placed in steep Trendelenburg.  Bowel was  away into the upper abdomen.  The omentum was adhesed to the anterior abdominal wall hernia/umbilical hernia, These ahesions were taken down with sharp dissection.The robot was docked in the normal manner.  The right and left peritoneum were opened parallel to the IP ligament to open the retroperitoneal spaces bilaterally. The SLN mapping was  performed in bilateral pelvic basins. The para rectal and paravesical spaces were opened up and a lymphatic channel was identified in the right parametrium extending to the right obturator node which was removed as a SLN, and the right external iliac SLN (separate channel). The left pelvic sidewall was opened up and a left pelvic SLN mapping was performed. No SLN's were identified on the left.  The hysterectomy was started after the round ligament on the right side was incised and the retroperitoneum was entered and the pararectal space was developed.  The ureter was noted to be on the medial leaf of the broad ligament.  The peritoneum above the ureter was incised and stretched and the infundibulopelvic ligament was skeletonized, cauterized and cut.  The posterior peritoneum was taken down to the level of the KOH ring.  The anterior peritoneum was also taken down.  The bladder flap was created to the level of the KOH ring.  The uterine artery on the right side was skeletonized, cauterized and cut in the normal manner.  A similar procedure was performed on the left.  The colpotomy was made and the uterus, cervix, bilateral ovaries and tubes were amputated and delivered through the vagina.  Pedicles were inspected and excellent hemostasis was achieved.    The left pelvic lymphadenectomy was performed by skeletonizing the internal iliac artery at the bifurcation with the external iliac artery. The obturator nerve was identified in the base of lateral paravesical space. The ureter was mobilized medially off of the dissection by developing the pararectal space. The genitofemoral nerve was identified, skeletonized and mobilized laterally off of the external iliac artery. An enbloc resection of lymph nodes was performed within the following boundaries: the mid portion of the common iliac proximally, the circumflex iliac vein distally, the obturator nerve posteriorally, the genitofemoral nerve laterally. The nodal basin  (including obturator space) were confirmed to be empty of nodes and hemostatic. The nodes were placed in an endocatch bag and retrieved vaginally.  The colpotomy at the vaginal cuff was closed with Vicryl on a CT1 needle in a running manner.  Irrigation was used and excellent hemostasis was achieved.  At this point in the procedure was completed.  Robotic instruments were removed under direct visulaization.  The robot was undocked. The 10 mm ports were closed with Vicryl on a UR-5 needle and the fascia was closed with 0 Vicryl on a UR-5 needle.  The skin was closed with 4-0 Vicryl in a subcuticular manner.  Dermabond was also applied.  Sponge, lap and needle counts correct x 2.  The patient was taken to the recovery room in stable condition.  The vagina was swabbed with  minimal bleeding noted.   All instrument and needle counts were correct x  3.   The patient was transferred to the recovery room in stable condition.   [name], MD

## 2014-06-24 ENCOUNTER — Encounter (HOSPITAL_COMMUNITY): Payer: Self-pay | Admitting: Gynecologic Oncology

## 2014-06-24 DIAGNOSIS — C541 Malignant neoplasm of endometrium: Secondary | ICD-10-CM | POA: Diagnosis not present

## 2014-06-24 LAB — BASIC METABOLIC PANEL
Anion gap: 8 (ref 5–15)
BUN: 12 mg/dL (ref 6–23)
CO2: 19 mmol/L (ref 19–32)
Calcium: 8.7 mg/dL (ref 8.4–10.5)
Chloride: 113 mmol/L — ABNORMAL HIGH (ref 96–112)
Creatinine, Ser: 0.71 mg/dL (ref 0.50–1.10)
GFR calc Af Amer: >90 mL/min (ref >90)
GFR calc non Af Amer: >90 mL/min (ref >90)
Glucose, Bld: 126 mg/dL — ABNORMAL HIGH (ref 70–99)
POTASSIUM: 3.8 mmol/L (ref 3.5–5.1)
SODIUM: 140 mmol/L (ref 135–145)

## 2014-06-24 LAB — CBC
HCT: 32.4 % — ABNORMAL LOW (ref 36.0–46.0)
Hemoglobin: 10.1 g/dL — ABNORMAL LOW (ref 12.0–15.0)
MCH: 25.8 pg — AB (ref 26.0–34.0)
MCHC: 31.2 g/dL (ref 30.0–36.0)
MCV: 82.7 fL (ref 78.0–100.0)
Platelets: 333 10*3/uL (ref 150–400)
RBC: 3.92 MIL/uL (ref 3.87–5.11)
RDW: 14.5 % (ref 11.5–15.5)
WBC: 10.9 10*3/uL — ABNORMAL HIGH (ref 4.0–10.5)

## 2014-06-24 MED ORDER — OXYCODONE HCL 10 MG PO TABS: 10.0000 mg | ORAL_TABLET | ORAL | Status: DC | PRN

## 2014-06-24 NOTE — Discharge Summary (Signed)
Physician Discharge Summary  Patient ID: Stacey Arroyo MRN: [id number] DOB/AGE: 05-08-1975 39 y.o.  Admit date: 06/23/2014 Discharge date: 06/24/2014  Admission Diagnoses: Endometrial cancer, grade I  Discharge Diagnoses:  Principal Problem:   Endometrial cancer, grade I Active Problems:   Endometrial cancer   Discharged Condition:  The patient is in good condition and stable for discharge.    Hospital Course: On 06/23/2014, the patient underwent the following: Procedure(s): ROBOTIC ASSISTED TOTAL HYSTERECTOMY WITH BILATERAL SALPINGO OOPHORECTOMY/SENTINAL LYMPHNODE LEFT PELVIC LYMPH BIOPSY.   The postoperative course was uneventful.  She was discharged to home on postoperative day 1 tolerating a regular diet.  Right hand numbness improving.  Full range of motion with her right hand, "can feel pressure when touching the hand, has sensation."  Consults: None  Significant Diagnostic Studies: None  Treatments: surgery: see above  Discharge Exam: Blood pressure 114/51, pulse 92, temperature 98.1 F (36.7 C), temperature source Oral, resp. rate 18, height 5\' 8"  (1.727 m), weight 305 lb (138.347 kg), SpO2 92 %. General appearance: alert, cooperative and no distress Resp: clear to auscultation bilaterally Cardio: regular rate and rhythm, S1, S2 normal, no murmur, click, rub or gallop GI: soft, non-tender; bowel sounds normal; no masses,  no organomegaly and abdomen obese Extremities: extremities normal, atraumatic, no cyanosis or edema Incision/Wound: Lap sites with dermabond without erythema or drainage Slight decrease in grip strength with right hand compared left.  Mild pain with forming a tight fist.  Full sensation of right forearm.  Pressure felt with touch but not full sensation.  Disposition: Home      Discharge Instructions    Call MD for:  difficulty breathing, headache or visual disturbances    Complete by:  As directed      Call MD for:  extreme fatigue    Complete by:   As directed      Call MD for:  hives    Complete by:  As directed      Call MD for:  persistant dizziness or light-headedness    Complete by:  As directed      Call MD for:  persistant nausea and vomiting    Complete by:  As directed      Call MD for:  redness, tenderness, or signs of infection (pain, swelling, redness, odor or green/yellow discharge around incision site)    Complete by:  As directed      Call MD for:  severe uncontrolled pain    Complete by:  As directed      Call MD for:  temperature >100.4    Complete by:  As directed      Diet - low sodium heart healthy    Complete by:  As directed      Driving Restrictions    Complete by:  As directed   No driving for 1 week.  Do not take narcotics and drive.     Increase activity slowly    Complete by:  As directed      Lifting restrictions    Complete by:  As directed   No lifting greater than 10 lbs.     Sexual Activity Restrictions    Complete by:  As directed   No sexual activity, nothing in the vagina, for 8 weeks.            Medication List    STOP taking these medications        megestrol 40 MG tablet  Commonly known as:  MEGACE  TAKE these medications        acetaminophen 500 MG tablet  Commonly known as:  TYLENOL  Take 500-1,000 mg by mouth every 6 (six) hours as needed for moderate pain or headache.     gabapentin 300 MG capsule  Commonly known as:  NEURONTIN  Take 300 mg by mouth 2 (two) times daily.     lisinopril 20 MG tablet  Commonly known as:  PRINIVIL,ZESTRIL  Take 20 mg by mouth every morning.     meloxicam 15 MG tablet  Commonly known as:  MOBIC  Take 15 mg by mouth every evening.     Oxycodone HCl 10 MG Tabs  Take 1 tablet (10 mg total) by mouth every 4 (four) hours as needed for moderate pain.     topiramate 200 MG tablet  Commonly known as:  TOPAMAX  Take 1 tablet by mouth 2 (two) times daily. States for pain management       Follow-up Information    Follow up with  [name], MD On 07/24/2014.   Specialty:  Obstetrics and Gynecology   Why:  at 10:45am at the [facility] information:   [place] [address] [phone]       Greater than thirty minutes were spend for face to face discharge instructions and discharge orders/summary in EPIC.   Signed: Eugenio Dollins DEAL 06/24/2014, 9:54 AM

## 2014-06-24 NOTE — Progress Notes (Signed)
Patient discharge instructions and prescriptions given.  Questions answered.  Patient husband signed discharged instructions at patient request due to tingling right hand.

## 2014-06-24 NOTE — Discharge Instructions (Signed)
06/24/2014  Return to work: 4-6 weeks if applicable  Activity: 1. Be up and out of the bed during the day.  Take a nap if needed.  You may walk up steps but be careful and use the hand rail.  Stair climbing will tire you more than you think, you may need to stop part way and rest.   2. No lifting or straining for 6 weeks.  3. No driving for 1 week(s).  Do not drive if you are taking narcotic pain medicine.  4. Shower daily.  Use soap and water on your incision and pat dry; don't rub.  No tub baths until cleared by your surgeon.   5. No sexual activity and nothing in the vagina for 8 weeks.  Diet: 1. Low sodium Heart Healthy Diet is recommended.  2. It is safe to use a laxative, such as Miralax or Colace, if you have difficulty moving your bowels.   Wound Care: 1. Keep clean and dry.  Shower daily.  Reasons to call the Doctor:  Fever - Oral temperature greater than 100.4 degrees Fahrenheit  Foul-smelling vaginal discharge  Difficulty urinating  Nausea and vomiting  Increased pain at the site of the incision that is unrelieved with pain medicine.  Difficulty breathing with or without chest pain  New calf pain especially if only on one side  Sudden, continuing increased vaginal bleeding with or without clots.   Contacts: For questions or concerns you should contact:  Dr. Everitt Amber at [phone]  Joylene John, NP at [phone]  After Hours: call [phone] and ask for the GYN Oncologist on call  Abdominal Hysterectomy, Care After These instructions give you information on caring for yourself after your procedure. Your doctor may also give you more specific instructions. Call your doctor if you have any problems or questions after your procedure.  HOME CARE It takes 4-6 weeks to recover from this surgery. Follow all of your doctor's instructions.   Only take medicines as told by your doctor.  Change your bandage as told by your doctor.  Return to your  doctor to have your stitches taken out.  Take showers for 2-3 weeks. Ask your doctor when it is okay to shower.  Do not douche, use tampons, or have sex (intercourse) for at least 6 weeks or as told.  Follow your doctor's advice about exercise, lifting objects, driving, and general activities.  Get plenty of rest and sleep.  Do not lift anything heavier than a gallon of milk (about 10 pounds [4.5 kilograms]) for the first month after surgery.  Get back to your normal diet as told by your doctor.  Do not drink alcohol until your doctor says it is okay.  Take a medicine to help you poop (laxative) as told by your doctor.  Eating foods high in fiber may help you poop. Eat a lot of raw fruits and vegetables, whole grains, and beans.  Drink enough fluids to keep your pee (urine) clear or pale yellow.  Have someone help you at home for 1-2 weeks after your surgery.  Keep follow-up doctor visits as told. GET HELP IF:  You have chills or fever.  You have puffiness, redness, or pain in area of the cut (incision).  You have yellowish-white fluid (pus) coming from the cut.  You have a bad smell coming from the cut or bandage.  Your cut pulls apart.  You feel dizzy or light-headed.  You have pain or bleeding when you pee.  You keep  having watery poop (diarrhea).  You keep feeling sick to your stomach (nauseous) or keep throwing up (vomiting).  You have fluid (discharge) coming from your vagina.  You have a rash.  You have a reaction to your medicine.  You need stronger pain medicine. GET HELP RIGHT AWAY IF:   You have a fever and your symptoms suddenly get worse.  You have bad belly (abdominal) pain.  You have chest pain.  You are short of breath.  You pass out (faint).  You have pain, puffiness, or redness of your leg.  You bleed a lot from your vagina and notice clumps of tissue (clots). MAKE SURE YOU:   Understand these instructions.  Will watch your  condition.  Will get help right away if you are not doing well or get worse. Document Released: 12/14/2007 Document Revised: 03/11/2013 Document Reviewed: 12/27/2012 [facility] Patient Information 2015 [place], [place]. This information is not intended to replace advice given to you by your health care provider. Make sure you discuss any questions you have with your health care provider.  Oxycodone tablets or capsules What is this medicine? OXYCODONE (ox i KOE done) is a pain reliever. It is used to treat moderate to severe pain. This medicine may be used for other purposes; ask your health care provider or pharmacist if you have questions. COMMON BRAND NAME(S): Dazidox, Endocodone, OXECTA, OxyIR, Percolone, Roxicodone What should I tell my health care provider before I take this medicine? They need to know if you have any of these conditions: -Addison's disease -brain tumor -drug abuse or addiction -head injury -heart disease -if you frequently drink alcohol containing drinks -kidney disease or problems going to the bathroom -liver disease -lung disease, asthma, or breathing problems -mental problems -an unusual or allergic reaction to oxycodone, codeine, hydrocodone, morphine, other medicines, foods, dyes, or preservatives -pregnant or trying to get pregnant -breast-feeding How should I use this medicine? Take this medicine by mouth with a glass of water. Follow the directions on the prescription label. You can take it with or without food. If it upsets your stomach, take it with food. Take your medicine at regular intervals. Do not take it more often than directed. Do not stop taking except on your doctor's advice. Some brands of this medicine, like Oxecta, have special instructions. Ask your doctor or pharmacist if these directions are for you: Do not cut, crush or chew this medicine. Swallow only one tablet at a time. Do not wet, soak, or lick the tablet before you take it. Talk to  your pediatrician regarding the use of this medicine in children. Special care may be needed. Overdosage: If you think you have taken too much of this medicine contact a poison control center or emergency room at once. NOTE: This medicine is only for you. Do not share this medicine with others. What if I miss a dose? If you miss a dose, take it as soon as you can. If it is almost time for your next dose, take only that dose. Do not take double or extra doses. What may interact with this medicine? -alcohol -antihistamines -certain medicines used for nausea like chlorpromazine, droperidol -erythromycin -ketoconazole -medicines for depression, anxiety, or psychotic disturbances -medicines for sleep -muscle relaxants -naloxone -naltrexone -narcotic medicines (opiates) for pain -nilotinib -phenobarbital -phenytoin -rifampin -ritonavir -voriconazole This list may not describe all possible interactions. Give your health care provider a list of all the medicines, herbs, non-prescription drugs, or dietary supplements you use. Also tell them if you  smoke, drink alcohol, or use illegal drugs. Some items may interact with your medicine. What should I watch for while using this medicine? Tell your doctor or health care professional if your pain does not go away, if it gets worse, or if you have new or a different type of pain. You may develop tolerance to the medicine. Tolerance means that you will need a higher dose of the medicine for pain relief. Tolerance is normal and is expected if you take this medicine for a long time. Do not suddenly stop taking your medicine because you may develop a severe reaction. Your body becomes used to the medicine. This does NOT mean you are addicted. Addiction is a behavior related to getting and using a drug for a non-medical reason. If you have pain, you have a medical reason to take pain medicine. Your doctor will tell you how much medicine to take. If your doctor  wants you to stop the medicine, the dose will be slowly lowered over time to avoid any side effects. You may get drowsy or dizzy when you first start taking this medicine or change doses. Do not drive, use machinery, or do anything that may be dangerous until you know how the medicine affects you. Stand or sit up slowly. There are different types of narcotic medicines (opiates) for pain. If you take more than one type at the same time, you may have more side effects. Give your health care provider a list of all medicines you use. Your doctor will tell you how much medicine to take. Do not take more medicine than directed. Call emergency for help if you have problems breathing. This medicine will cause constipation. Try to have a bowel movement at least every 2 to 3 days. If you do not have a bowel movement for 3 days, call your doctor or health care professional. Your mouth may get dry. Drinking water, chewing sugarless gum, or sucking on hard candy may help. See your dentist every 6 months. What side effects may I notice from receiving this medicine? Side effects that you should report to your doctor or health care professional as soon as possible: -allergic reactions like skin rash, itching or hives, swelling of the face, lips, or tongue -breathing problems -confusion -feeling faint or lightheaded, falls -trouble passing urine or change in the amount of urine -unusually weak or tired Side effects that usually do not require medical attention (report to your doctor or health care professional if they continue or are bothersome): -constipation -dry mouth -itching -nausea, vomiting -upset stomach This list may not describe all possible side effects. Call your doctor for medical advice about side effects. You may report side effects to FDA at 1-800-FDA-1088. Where should I keep my medicine? Keep out of the reach of children. This medicine can be abused. Keep your medicine in a safe place to  protect it from theft. Do not share this medicine with anyone. Selling or giving away this medicine is dangerous and against the law. Store at room temperature between 15 and 30 degrees C (59 and 86 degrees F). Protect from light. Keep container tightly closed. This medicine may cause accidental overdose and death if it is taken by other adults, children, or pets. Flush any unused medicine down the toilet to reduce the chance of harm. Do not use the medicine after the expiration date. NOTE: This sheet is a summary. It may not cover all possible information. If you have questions about this medicine, talk to your  doctor, pharmacist, or health care provider.  2015, [employer] Standard. (2012-11-14 13:43:33)

## 2014-06-26 ENCOUNTER — Telehealth: Payer: Self-pay | Admitting: Nurse Practitioner

## 2014-06-26 NOTE — Telephone Encounter (Signed)
Per Joylene John, NP, RN called to assess patient's R hand numbness. She states it is much improved, almost completely resolved with only "a little tightness" in fingertips. She has complete functionality and range of motion in hands. Patient otherwise noting bilateral neck and chest soreness after surgery but improved today. Per NP, she was in trendelenburg position during surgery and could be a potential source of pressure and discomfort for patient. Pt instructed to report to ED with chest pain or trouble breathing. Encouraged to use heat prn for relief. Pt reports having BM but still feels "gassy," RN encouraged ambulation and continued adherence to post op instructions. Patient verbalizes understanding of the above and thanks for the call.

## 2014-06-30 ENCOUNTER — Other Ambulatory Visit (HOSPITAL_BASED_OUTPATIENT_CLINIC_OR_DEPARTMENT_OTHER): Payer: BLUE CROSS/BLUE SHIELD

## 2014-06-30 ENCOUNTER — Telehealth: Payer: Self-pay | Admitting: *Deleted

## 2014-06-30 DIAGNOSIS — R300 Dysuria: Secondary | ICD-10-CM | POA: Diagnosis not present

## 2014-06-30 LAB — URINALYSIS, MICROSCOPIC - CHCC
BILIRUBIN (URINE): NEGATIVE
BLOOD: NEGATIVE
Glucose: NEGATIVE mg/dL
Ketones: NEGATIVE mg/dL
NITRITE: NEGATIVE
PH: 6 (ref 4.6–8.0)
Protein: NEGATIVE mg/dL
SPECIFIC GRAVITY, URINE: 1.01 (ref 1.003–1.035)
Urobilinogen, UR: 0.2 mg/dL (ref 0.2–1)

## 2014-06-30 NOTE — Telephone Encounter (Signed)
Patient called this morning stating that her right hand numbness "is 95% better" but starting this past Sunday her right upper arm where she thinks the blood pressure cuff was during surgery is sore. Patient denies any swelling in her right arm. Per Joylene John, NP patient advised to continue to monitor and to call our clinic back if it worsens or persists over the next week. Patient agreeable to this.   She also states that since Sunday she has had increased urination and bladder pressure but denies any burning with urination or fevers. Patient states that starting yesterday she also started experiencing some lower back pain and is wondering if she possibly has a urinary tract infection. Patient agreeable to come in to [facility] lab today for UA and culture.   Patient inquiring about surgical pathology - let patient know that once it is reviewed by Dr. Denman George she will receive a call from our clinic.

## 2014-07-01 ENCOUNTER — Telehealth: Payer: Self-pay | Admitting: Nurse Practitioner

## 2014-07-01 ENCOUNTER — Telehealth: Payer: Self-pay | Admitting: Gynecologic Oncology

## 2014-07-01 ENCOUNTER — Other Ambulatory Visit: Payer: Self-pay | Admitting: Nurse Practitioner

## 2014-07-01 DIAGNOSIS — E8940 Asymptomatic postprocedural ovarian failure: Secondary | ICD-10-CM

## 2014-07-01 DIAGNOSIS — C541 Malignant neoplasm of endometrium: Secondary | ICD-10-CM

## 2014-07-01 LAB — URINE CULTURE

## 2014-07-01 MED ORDER — ESTRADIOL 0.0375 MG/24HR TD PTWK: 0.0375 mg | MEDICATED_PATCH | TRANSDERMAL | Status: DC

## 2014-07-01 NOTE — Telephone Encounter (Signed)
Informed patient about results - stage IA cancer. Grade II. Low risk features. No adjuvant therapy indicated. Discussed 2-5% risk for recurrence. Recommend surveillance care.  Cl[place]ara for HRT.  [name], MD

## 2014-07-01 NOTE — Telephone Encounter (Signed)
Patient calling to get urinalysis results from yesterday. Results given to Joylene John, NP for review. Patient reporting increased pressure during urination that progresses with each void. She denies burning or blood in urine. Will call patient back with recommendations per NP.   Returned call with MD recommendation for CT AP with contrast. Scheduled 07/03/14 at 3:30 at [facility]. Patient instructed to be NPO at 11:00 on Friday and will drink 1st contrast bottle at 1:30, 2nd bottle at 2:30. Informed those are available for pick up at [facility]. She will either send her husband to pick up contrast bottles or will arrive at 1:30 on Friday to pick up and drink here prior to scan. Patient verbalizes understanding of NPO and contrast instructions. She knows to call our clinic with any other questions or concerns in the meantime. She is asking about her surgical pathology report. Will inform Dr. Denman George of this.

## 2014-07-01 NOTE — Progress Notes (Signed)
CT AP canceled per Dr. Denman George on 07/01/14; MD discussed this with patient.

## 2014-07-03 ENCOUNTER — Ambulatory Visit (HOSPITAL_COMMUNITY): Payer: BLUE CROSS/BLUE SHIELD

## 2014-07-24 ENCOUNTER — Ambulatory Visit: Payer: BLUE CROSS/BLUE SHIELD

## 2014-07-24 ENCOUNTER — Ambulatory Visit: Payer: BLUE CROSS/BLUE SHIELD | Attending: Gynecologic Oncology | Admitting: Gynecologic Oncology

## 2014-07-24 ENCOUNTER — Encounter: Payer: Self-pay | Admitting: Gynecologic Oncology

## 2014-07-24 VITALS — BP 137/79 | HR 74 | Temp 98.2°F | Resp 20 | Wt 306.3 lb

## 2014-07-24 DIAGNOSIS — Z90710 Acquired absence of both cervix and uterus: Secondary | ICD-10-CM

## 2014-07-24 DIAGNOSIS — N951 Menopausal and female climacteric states: Secondary | ICD-10-CM

## 2014-07-24 DIAGNOSIS — C541 Malignant neoplasm of endometrium: Secondary | ICD-10-CM | POA: Insufficient documentation

## 2014-07-24 DIAGNOSIS — R300 Dysuria: Secondary | ICD-10-CM | POA: Diagnosis not present

## 2014-07-24 MED ORDER — ONDANSETRON 4 MG PO TBDP: 4.0000 mg | ORAL_TABLET | Freq: Three times a day (TID) | ORAL | Status: DC | PRN

## 2014-07-24 NOTE — Progress Notes (Signed)
POSTOPERATIVE FOLLOWUP VISIT  Assessment:    39 y.o. 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   S/p robotic hysterectomy, BSO, sentinel lymph node biopsy on 06/23/14. no LVSI, 15% myometrial invasion, negative pelvic washings and negative lymph nodes.   Plan: 1) Pathology reports reviewed today 2) Treatment counseling - Very low risk (<5%) for recurrence given age, grade, depth of myometrial invasion and LVSI status. Multidisciplinary tumor board recommendation is for routine surveillance with frequent pelvic exams and visits . We discussed that there is no role for screening CT scans or pap smears. If a vaginal lesion is identified on exam we would biopsy this.  We will start with visits every 6 months x 5 years, at which time she can return to annual visits.  Discussed signs and symptoms of recurrence including vaginal bleeding or discharge, leg pain or swelling and changes in bowel or bladder habits. I discussed that she can alternate followup visits with Dr Toney Rakes and myself She was given the opportunity to ask questions, which were answered to her satisfaction, and she is agreement with the above mentioned plan of care. 3) abdominal fullness: feel that this is most consistent with postop edema/lymph collection in peritoneal cavity. Is usually self limited. 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 May need alteration of dosing if symptoms of menopause are problematic 5) dysuria: check Urine culture today. 6) Morbid obesity: we discussed the poorer prognosis associated with endometrial cancer in obese women (decreased survival, increased recurrence risk). I offered referral to bariatric surgery. She is electing to pursue increased exercise options. 7)  Return to clinic in 6 months to see Dr Toney Rakes and myself in 12 months.  HPI:  Stacey Arroyo is a 39 y.o. year old G2P2002 initially seen in consultation referred by Dr Toney Rakes for endometrial cancer.  She then underwent a robotic  hysterectomy, BSO and sentinel lymph node biopsy on 4/0/98 without complications.  Her postoperative course was uncomplicated.  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 MMR protein testing (for possible lynch syndrome) was negative.  She is seen today for a postoperative check and to discuss her pathology results and treatment plan.  Since discharge from the hospital, she is feeling abdominal fullness and heaviness. Worse with sitting. Feels nauseated after eating some. Her first morning urine void is "uncomfortable".  Overall she is improving. She has no fevers. Her pain is minimal. She began estrogen replacement therapy (transdermal) 1 week ago and has noted some flushing feelings since that time.  No Known Allergies  Current Outpatient Prescriptions on File Prior to Visit  Medication Sig Dispense Refill  . estradiol (CLIMARA) 0.0375 mg/24hr patch Place 1 patch (0.0375 mg total) onto the skin once a week. 4 patch 12  . gabapentin (NEURONTIN) 300 MG capsule Take 300 mg by mouth 2 (two) times daily.     Marland Kitchen lisinopril (PRINIVIL,ZESTRIL) 20 MG tablet Take 20 mg by mouth every morning.     . meloxicam (MOBIC) 15 MG tablet Take 15 mg by mouth every evening.     . topiramate (TOPAMAX) 200 MG tablet Take 1 tablet by mouth 2 (two) times daily. States for pain management    . acetaminophen (TYLENOL) 500 MG tablet Take 500-1,000 mg by mouth every 6 (six) hours as needed for moderate pain or headache.     No current facility-administered medications on file prior  to visit.    Past Medical History  Diagnosis Date  . Osteoarthritis   . Cancer     endometrial    Past Surgical History  Procedure Laterality Date  . Knee surgery Left     scope, acl x 6  . Breast reduction surgery    . Joint replacement Left 12/15    total knee  . Knee surgery Right      scope with partial acl  . Tonsillectomy    . Foot surgery Right   . Cesarean section      x 2  . Robotic assisted total hysterectomy with bilateral salpingo oopherectomy N/A 06/23/2014    Procedure: ROBOTIC ASSISTED TOTAL HYSTERECTOMY WITH BILATERAL SALPINGO OOPHORECTOMY/SENTINAL LYMPHNODE LEFT PELVIC LYMPH BIOPSY;  Surgeon: Everitt Amber, MD;  Location: [facility];  Service: Gynecology;  Laterality: N/A;    History   Social History  . Marital Status: Married    Spouse Name: N/A  . Number of Children: N/A  . Years of Education: N/A   Occupational History  . Not on file.   Social History Main Topics  . Smoking status: Former Smoker -- 0.25 packs/day for 2 years    Quit date: 06/17/1998  . Smokeless tobacco: Never Used  . Alcohol Use: No  . Drug Use: No  . Sexual Activity: Yes    Birth Control/ Protection: Condom   Other Topics Concern  . Not on file   Social History Narrative    Family History  Problem Relation Age of Onset  . Diabetes Father       Review of systems: Constitutional:  She has no weight gain or weight loss. She has no fever or chills. Eyes: No blurred vision Ears, Nose, Mouth, Throat: No dizziness, headaches or changes in hearing. No mouth sores. Cardiovascular: No chest pain, palpitations or edema. Respiratory:  No shortness of breath, wheezing or cough Gastrointestinal: She has normal bowel movements without diarrhea or constipation. She denies any nausea or vomiting. She denies blood in her stool or heart burn. Genitourinary:  She denies pelvic pain, pelvic pressure or changes in her urinary function. She has no hematuria, dysuria, or incontinence. She has no irregular vaginal bleeding or vaginal discharge Musculoskeletal: Denies muscle weakness or joint pains.  Skin:  She has no skin changes, rashes or itching Neurological:  Denies dizziness or headaches. No neuropathy, no numbness or tingling. Psychiatric:  She denies depression or  anxiety. Hematologic/Lymphatic:   No easy bruising or bleeding   Physical Exam: Blood pressure 137/79, pulse 74, temperature 98.2 F (36.8 C), temperature source Oral, resp. rate 20, weight 306 lb 4.8 oz (138.937 kg). General: Well dressed, well nourished in no apparent distress.   HEENT:  Normocephalic and atraumatic, no lesions.  Extraocular muscles intact. Sclerae anicteric. Pupils equal, round, reactive. No mouth sores or ulcers. Thyroid is normal size, not nodular, midline. Skin:  No lesions or rashes. Breasts:  Soft, symmetric.  No skin or nipple changes.  No palpable LN or masses. Lungs:  Clear to auscultation bilaterally.  No wheezes. Cardiovascular:  Regular rate and rhythm.  No murmurs or rubs. Abdomen:  Soft, nontender, nondistended.  No palpable masses.  No hepatosplenomegaly.  No ascites. Normal bowel sounds.  No hernias.  Incisions are well healed. Genitourinary: Normal EGBUS  Vaginal cuff intact.  No bleeding or discharge.  No cul de sac fullness. Extremities: No cyanosis, clubbing or edema.  No calf tenderness or erythema. No palpable cords. Psychiatric: Mood and affect are appropriate.  Neurological: Awake, alert and oriented x 3. Sensation is intact, no neuropathy.  Musculoskeletal: No pain, normal strength and range of motion.  25 minutes of face to face counseling, particularly regarding her cancer pathology, prognosis and implications was conducted. We also discussed obesity and weight loss   [name], MD

## 2014-07-24 NOTE — Patient Instructions (Signed)
Plan to follow up with Dr. Toney Rakes in six months and Dr. Denman George in one year or sooner if needed.  Please call to schedule to appointment with Dr. Toney Rakes and call us in Jan or Feb 2017 to schedule for May 2017 with Dr. Denman George.  Use the zofran for nausea as needed and if constipation develops you can discontinue the medication or use a medication over the counter for constipation such as Miralax.  Follow up with Dr. Toney Rakes in one month if symptoms (including hot flashes, nausea) worsen or persist for possible medication adjustment.  Please call for any questions or concerns.

## 2014-07-26 LAB — URINE CULTURE

## 2014-07-28 ENCOUNTER — Other Ambulatory Visit: Payer: Self-pay | Admitting: *Deleted

## 2014-07-28 ENCOUNTER — Telehealth: Payer: Self-pay | Admitting: *Deleted

## 2014-07-28 DIAGNOSIS — R300 Dysuria: Secondary | ICD-10-CM

## 2014-07-28 MED ORDER — AMPICILLIN 250 MG PO CAPS: 250.0000 mg | ORAL_CAPSULE | Freq: Four times a day (QID) | ORAL | Status: DC

## 2014-07-28 NOTE — Telephone Encounter (Signed)
Called and left voicemail on patient's cell phone letting her know that a prescription for an antibiotic has been sent to her pharmacy to take for 5 days for a UTI.  Requested return call from patient to ensure that she received VM and to let us know if she had any additional questions.

## 2014-07-29 NOTE — Telephone Encounter (Signed)
Patient returned call. She states she received the voicemail and is on her way to the pharmacy to pick up the antibiotic and start taking it today. Told patient that if her urinary symptoms do not resolve to please call our office back - patient agreeable to this.

## 2014-07-29 NOTE — Telephone Encounter (Signed)
Attempted to reach patient again today by phone to ensure she received my voicemail and was able to pick up antibiotic. Requested return call.

## 2015-02-09 ENCOUNTER — Encounter: Payer: BC Managed Care – PPO | Admitting: Women's Health

## 2015-03-11 ENCOUNTER — Encounter: Payer: BLUE CROSS/BLUE SHIELD | Admitting: Gynecology

## 2015-04-27 ENCOUNTER — Telehealth: Payer: Self-pay | Admitting: Gynecologic Oncology

## 2015-04-27 NOTE — Telephone Encounter (Signed)
"  Doing ok but have been better."  "I had a similar situation that happened last month.  Last month, I experienced a lot of lower abdominal pressure with a strong urge to urinate with no burning.  It only lasted a half a day then went away.  Sunday evening, the pressure returned with no burning when urinating but a strong urge.  I was fine on Monday then it returned again this am.  I have felt that way for the past couple hours and this time it is more geared up.  It comes and goes.  Nothing makes it better or worse.  No bleeding from the bladder, vagina, or rectum.  Two nights ago, I felt like I needed to have a bowel movement but I couldn't.  I have had that feeling several times.  I have had no vagina bleeding but have had clear, thick, drainage from the vagina at times.  I am supposed to have my right knee replaced at the end of March and was wondering if I could get my scans and appointment before then.  Also the estrogen patch is irritating my skin and I think I need to switch."  Patient advised to come in tomorrow for an appointment to meet with Dr. Denman George for a pelvic examination along with a urinalysis/culture.  Verbalizing understanding.  Reportable signs and symptoms reviewed.  She is advised to call the office if the symptoms worsen later today.

## 2015-04-28 ENCOUNTER — Encounter: Payer: Self-pay | Admitting: Gynecologic Oncology

## 2015-04-28 ENCOUNTER — Ambulatory Visit: Payer: BLUE CROSS/BLUE SHIELD | Attending: Gynecologic Oncology | Admitting: Gynecologic Oncology

## 2015-04-28 ENCOUNTER — Ambulatory Visit (HOSPITAL_BASED_OUTPATIENT_CLINIC_OR_DEPARTMENT_OTHER): Payer: BLUE CROSS/BLUE SHIELD

## 2015-04-28 VITALS — BP 118/83 | HR 85 | Temp 98.2°F | Resp 18 | Ht 68.0 in | Wt 339.8 lb

## 2015-04-28 DIAGNOSIS — Z87891 Personal history of nicotine dependence: Secondary | ICD-10-CM | POA: Insufficient documentation

## 2015-04-28 DIAGNOSIS — R300 Dysuria: Secondary | ICD-10-CM

## 2015-04-28 DIAGNOSIS — C541 Malignant neoplasm of endometrium: Secondary | ICD-10-CM

## 2015-04-28 DIAGNOSIS — M25559 Pain in unspecified hip: Secondary | ICD-10-CM | POA: Diagnosis not present

## 2015-04-28 DIAGNOSIS — Z90710 Acquired absence of both cervix and uterus: Secondary | ICD-10-CM | POA: Diagnosis not present

## 2015-04-28 DIAGNOSIS — R109 Unspecified abdominal pain: Secondary | ICD-10-CM | POA: Diagnosis not present

## 2015-04-28 DIAGNOSIS — E8941 Symptomatic postprocedural ovarian failure: Secondary | ICD-10-CM

## 2015-04-28 DIAGNOSIS — M1990 Unspecified osteoarthritis, unspecified site: Secondary | ICD-10-CM | POA: Insufficient documentation

## 2015-04-28 DIAGNOSIS — Z833 Family history of diabetes mellitus: Secondary | ICD-10-CM | POA: Diagnosis not present

## 2015-04-28 LAB — URINALYSIS, MICROSCOPIC - CHCC
BLOOD: NEGATIVE
Bilirubin (Urine): NEGATIVE
Glucose: NEGATIVE mg/dL
Ketones: NEGATIVE mg/dL
NITRITE: NEGATIVE
PROTEIN: NEGATIVE mg/dL
RBC / HPF: NEGATIVE (ref 0–2)
Specific Gravity, Urine: 1.01 (ref 1.003–1.035)
UROBILINOGEN UR: 0.2 mg/dL (ref 0.2–1)
pH: 7 (ref 4.6–8.0)

## 2015-04-28 MED ORDER — ESTRADIOL 0.5 MG PO TABS
0.5000 mg | ORAL_TABLET | Freq: Every day | ORAL | Status: DC
Start: 2015-04-28 — End: 2016-06-21

## 2015-04-28 NOTE — Patient Instructions (Signed)
Plan for CT scan on 05/05/15 at 12:00 at [facility].  Arrive at 11:45am.  Nothing to eat or drink 4 hours before your scan.  We will call you with the results of your urine sample as well.

## 2015-04-28 NOTE — Progress Notes (Signed)
GYN ONC FOLLOWUP VISIT  Assessment:    40 y.o. 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   S/p robotic hysterectomy, BSO, sentinel lymph node biopsy on 06/23/14. no LVSI, 15% myometrial invasion, negative pelvic washings and negative lymph nodes.  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 Does not like patch - change to oral estradiol 3) dysuria: check Urine culture today. 4) Morbid obesity: we discussed the poorer prognosis associated with endometrial cancer in obese women (decreased survival, increased recurrence risk). I offered referral to bariatric surgery. She is electing to pursue increased exercise options. However, she has gained 30lbs in past 6 months. Feels that after knee replacement things will get better 5)  Return to clinic in 6 months to see Dr Toney Rakes and myself in 12 months.  HPI:  Stacey Arroyo is a 40 y.o. year old G2P2002 initially seen in consultation referred by Dr Toney Rakes for endometrial cancer.  She then underwent a robotic hysterectomy, BSO and sentinel lymph node biopsy on 0/2/54 without complications.  Her postoperative course was uncomplicated.  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 MMR protein testing (for possible lynch syndrome) was negative.  Interval Hx: The patient has experienced 2 weeks of intermittent pelvic pressure and slight intermittent thick white vaginal discharge. Denies bleeding. Some dysuria is present but "doesn't feel like a typical UTI". She is eating normally and stooling normally. She is getting ready for knee replacement surgery. She has gained 35lbs in 6 months.  No Known Allergies  Current Outpatient Prescriptions on File  Prior to Visit  Medication Sig Dispense Refill  . estradiol (CLIMARA) 0.0375 mg/24hr patch Place 1 patch (0.0375 mg total) onto the skin once a week. 4 patch 12  . lisinopril (PRINIVIL,ZESTRIL) 20 MG tablet Take 20 mg by mouth every morning.     . meloxicam (MOBIC) 15 MG tablet Take 15 mg by mouth every evening.     . gabapentin (NEURONTIN) 300 MG capsule Take 300 mg by mouth 2 (two) times daily.     Marland Kitchen lidocaine (LIDODERM) 5 % Reported on 04/28/2015    . ondansetron (ZOFRAN ODT) 4 MG disintegrating tablet Take 1 tablet (4 mg total) by mouth every 8 (eight) hours as needed for nausea or vomiting. (Patient not taking: Reported on 04/28/2015) 20 tablet 0  . oxyCODONE-acetaminophen (PERCOCET) 7.5-325 MG per tablet Reported on 04/28/2015     No current facility-administered medications on file prior to visit.    Past Medical History  Diagnosis Date  . Osteoarthritis   . Cancer [facility])     endometrial    Past Surgical History  Procedure Laterality Date  . Knee surgery Left     scope, acl x 6  . Breast reduction surgery    . Joint replacement Left 12/15    total knee  . Knee surgery Right     scope with partial acl  . Tonsillectomy    . Foot surgery Right   . Cesarean section      x 2  . Robotic assisted total hysterectomy with bilateral salpingo oopherectomy N/A 06/23/2014    Procedure: ROBOTIC ASSISTED TOTAL HYSTERECTOMY WITH BILATERAL SALPINGO OOPHORECTOMY/SENTINAL LYMPHNODE LEFT PELVIC LYMPH BIOPSY;  Surgeon: Everitt Amber, MD;  Location:  [facility];  Service: Gynecology;  Laterality: N/A;    Social History   Social History  . Marital Status: Married    Spouse Name: N/A  . Number of Children: N/A  . Years of Education: N/A   Occupational History  . Not on file.   Social History Main Topics  . Smoking status: Former Smoker -- 0.25 packs/day for 2 years    Quit date: 06/17/1998  . Smokeless tobacco: Never Used  . Alcohol Use: No  . Drug Use: No  . Sexual Activity: Yes    Birth  Control/ Protection: Condom   Other Topics Concern  . Not on file   Social History Narrative    Family History  Problem Relation Age of Onset  . Diabetes Father       Review of systems: Constitutional:  She has no weight gain or weight loss. She has no fever or chills. Eyes: No blurred vision Ears, Nose, Mouth, Throat: No dizziness, headaches or changes in hearing. No mouth sores. Cardiovascular: No chest pain, palpitations or edema. Respiratory:  No shortness of breath, wheezing or cough Gastrointestinal: She has normal bowel movements without diarrhea or constipation. She denies any nausea or vomiting. She denies blood in her stool or heart burn. Genitourinary:  She denies pelvic pain, pelvic pressure or changes in her urinary function. She has no hematuria, dysuria, or incontinence. She has no irregular vaginal bleeding or vaginal discharge Musculoskeletal: Denies muscle weakness or joint pains.  Skin:  She has no skin changes, rashes or itching Neurological:  Denies dizziness or headaches. No neuropathy, no numbness or tingling. Psychiatric:  She denies depression or anxiety. Hematologic/Lymphatic:   No easy bruising or bleeding   Physical Exam: Blood pressure 118/83, pulse 85, temperature 98.2 F (36.8 C), temperature source Oral, resp. rate 18, height '5\' 8"'$  (1.727 m), weight 339 lb 12.8 oz (154.132 kg), SpO2 99 %. General: Well dressed, well nourished in no apparent distress.   HEENT:  Normocephalic and atraumatic, no lesions.  Extraocular muscles intact. Sclerae anicteric. Pupils equal, round, reactive. No mouth sores or ulcers. Thyroid is normal size, not nodular, midline. Skin:  No lesions or rashes. Breasts:  Soft, symmetric.  No skin or nipple changes.  No palpable LN or masses. Lungs:  Clear to auscultation bilaterally.  No wheezes. Cardiovascular:  Regular rate and rhythm.  No murmurs or rubs. Abdomen:  Soft, nontender, nondistended.  No palpable masses.  No  hepatosplenomegaly.  No ascites. Normal bowel sounds.  No hernias.  Genitourinary: Normal EGBUS  Vaginal cuff intact.  No bleeding or discharge.  No cul de sac fullness. Extremities: No cyanosis, clubbing or edema.  No calf tenderness or erythema. No palpable cords. Psychiatric: Mood and affect are appropriate. Neurological: Awake, alert and oriented x 3. Sensation is intact, no neuropathy.  Musculoskeletal: No pain, normal strength and range of motion.   [name], MD

## 2015-04-29 ENCOUNTER — Telehealth: Payer: Self-pay

## 2015-04-29 NOTE — Telephone Encounter (Signed)
Orders received from [facility] to contact the patient and update with U/A results are in , but we are waiting for the culture and sensitivity results and we will update her with the results. Attempted to reach the patient , no answer , left a detailed message with call back requested , our contact information was provided .

## 2015-04-30 LAB — URINE CULTURE

## 2015-04-30 MED ORDER — SULFAMETHOXAZOLE-TRIMETHOPRIM 800-160 MG PO TABS: 1.0000 | ORAL_TABLET | Freq: Two times a day (BID) | ORAL | Status: AC

## 2015-04-30 NOTE — Addendum Note (Signed)
Addended by: Elizebeth Koller on: 04/30/2015 01:41 PM   Modules accepted: Orders

## 2015-04-30 NOTE — Telephone Encounter (Signed)
Orders received from [facility] to contact the patient to update that her U/A Culture came back "positive" for UTI . Orders received for Bactrim DS PO BID for five days. Patient contacted and updated with results and recommendations for Bactrim DS for five days . Patient states understanding ,denies further questions at this time , patient also reminded of her upcoming CT scan scheduled for 05/05/2015 at 12 noon . Patient states being aware of CT scan , call ended.

## 2015-05-05 ENCOUNTER — Ambulatory Visit (HOSPITAL_COMMUNITY)
Admission: RE | Admit: 2015-05-05 | Discharge: 2015-05-05 | Disposition: A | Discharge status: Home / Self Care | Payer: BLUE CROSS/BLUE SHIELD | Source: Ambulatory Visit | Attending: Gynecologic Oncology | Admitting: Gynecologic Oncology

## 2015-05-05 ENCOUNTER — Encounter (HOSPITAL_COMMUNITY): Payer: Self-pay

## 2015-05-05 DIAGNOSIS — K760 Fatty (change of) liver, not elsewhere classified: Secondary | ICD-10-CM | POA: Diagnosis not present

## 2015-05-05 DIAGNOSIS — C541 Malignant neoplasm of endometrium: Secondary | ICD-10-CM

## 2015-05-05 DIAGNOSIS — N200 Calculus of kidney: Secondary | ICD-10-CM | POA: Diagnosis not present

## 2015-05-05 MED ORDER — IOHEXOL 300 MG/ML  SOLN
100.0000 mL | Freq: Once | INTRAMUSCULAR | Status: AC | PRN
  Administered 2015-05-05: 100 mL via INTRAVENOUS

## 2015-05-06 ENCOUNTER — Telehealth: Payer: Self-pay | Admitting: Gynecologic Oncology

## 2015-05-06 DIAGNOSIS — R109 Unspecified abdominal pain: Secondary | ICD-10-CM

## 2015-05-06 DIAGNOSIS — R300 Dysuria: Secondary | ICD-10-CM

## 2015-05-06 MED ORDER — SULFAMETHOXAZOLE-TRIMETHOPRIM 800-160 MG PO TABS: 1.0000 | ORAL_TABLET | Freq: Two times a day (BID) | ORAL | Status: DC

## 2015-05-06 NOTE — Telephone Encounter (Signed)
Patient informed of CT scan results.  Patient stating she still has the bladder pressure that has lessened some with abx use.  Requesting additional refill on the medication.  Urine culture showing susceptibility to bactrim.  Another three days sent in for the patient and she is advised to call if the symptoms persist.  Advised to call for any needs.

## 2016-03-18 IMAGING — CR DG CHEST 2V
2 series · 2 of 2 positions shown · non-contrast
Comparison: PA and lateral chest of [DATE]

CLINICAL DATA: Preoperative exam prior to hysterectomy on [REDACTED]; history of endometrial malignancy, hypertension.

EXAM:
CHEST  2 VIEW

[w chest pa]
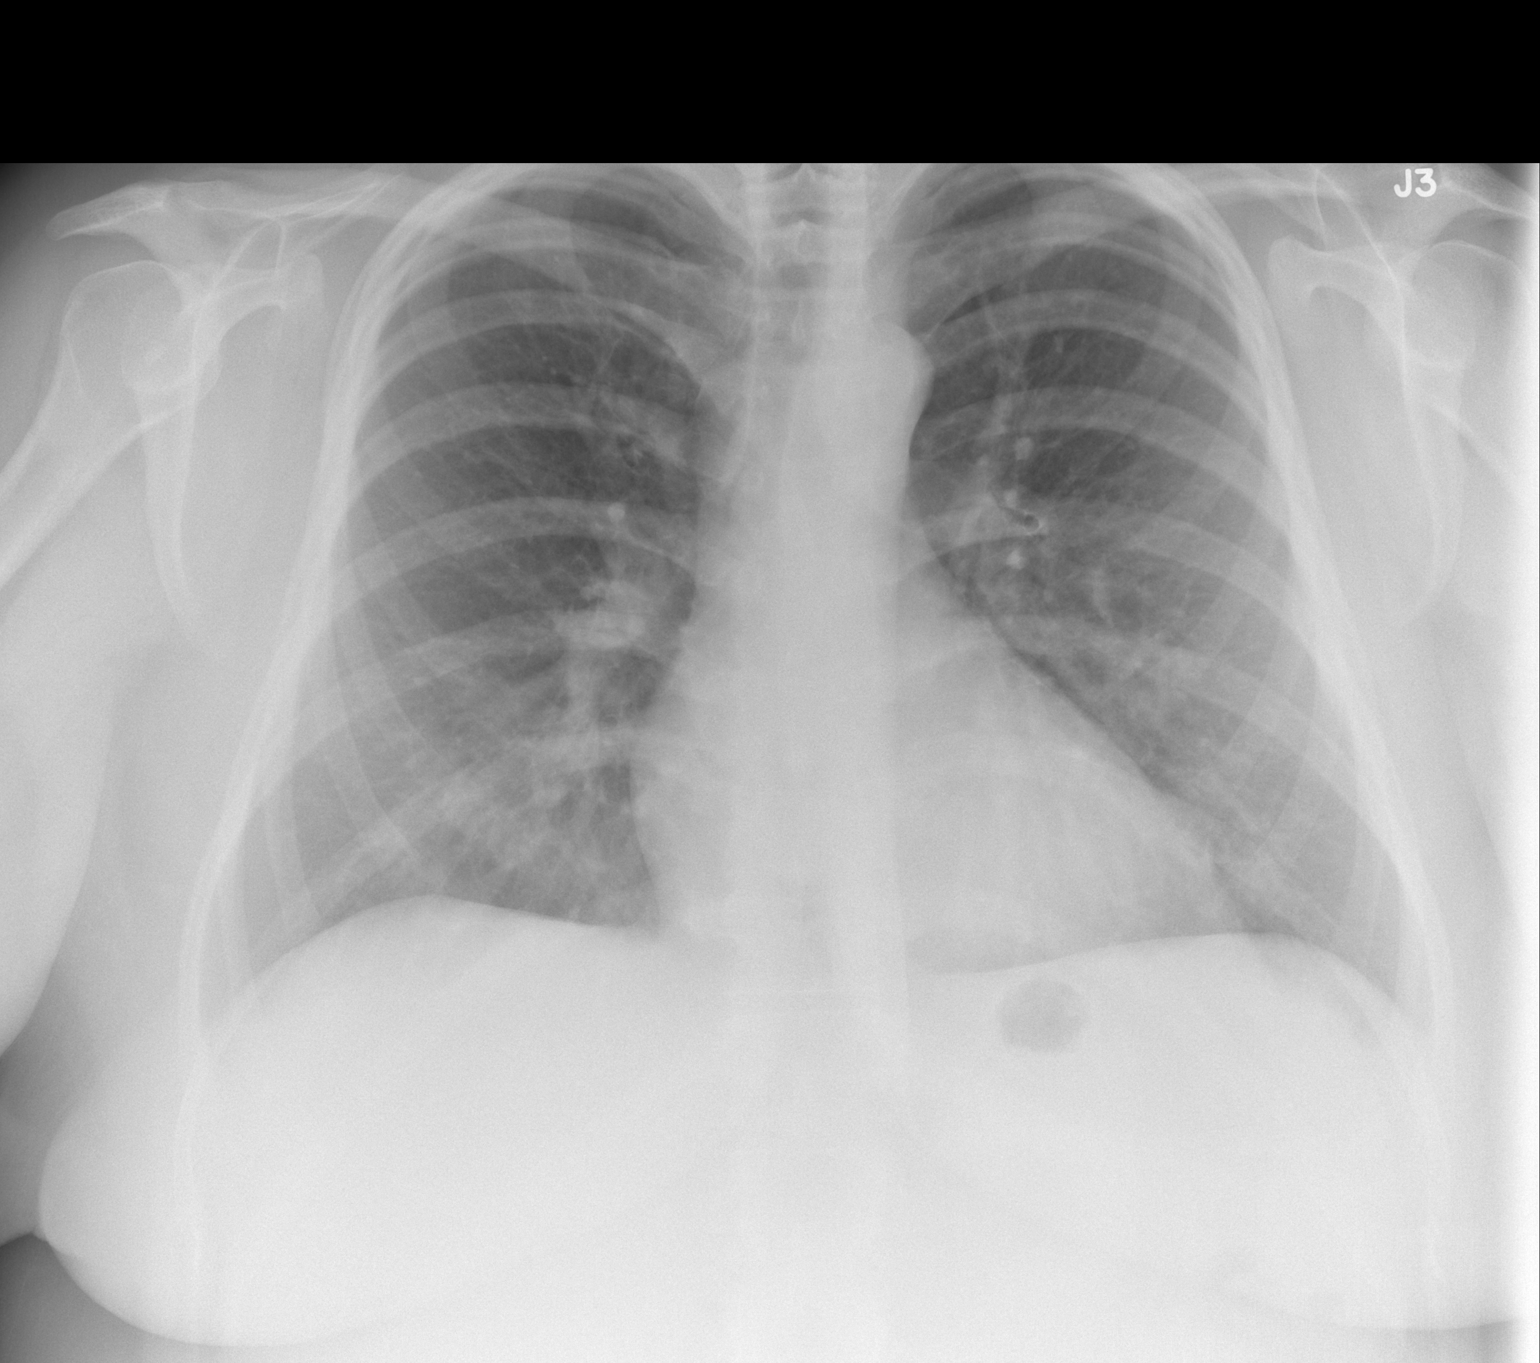

[w chest lat]
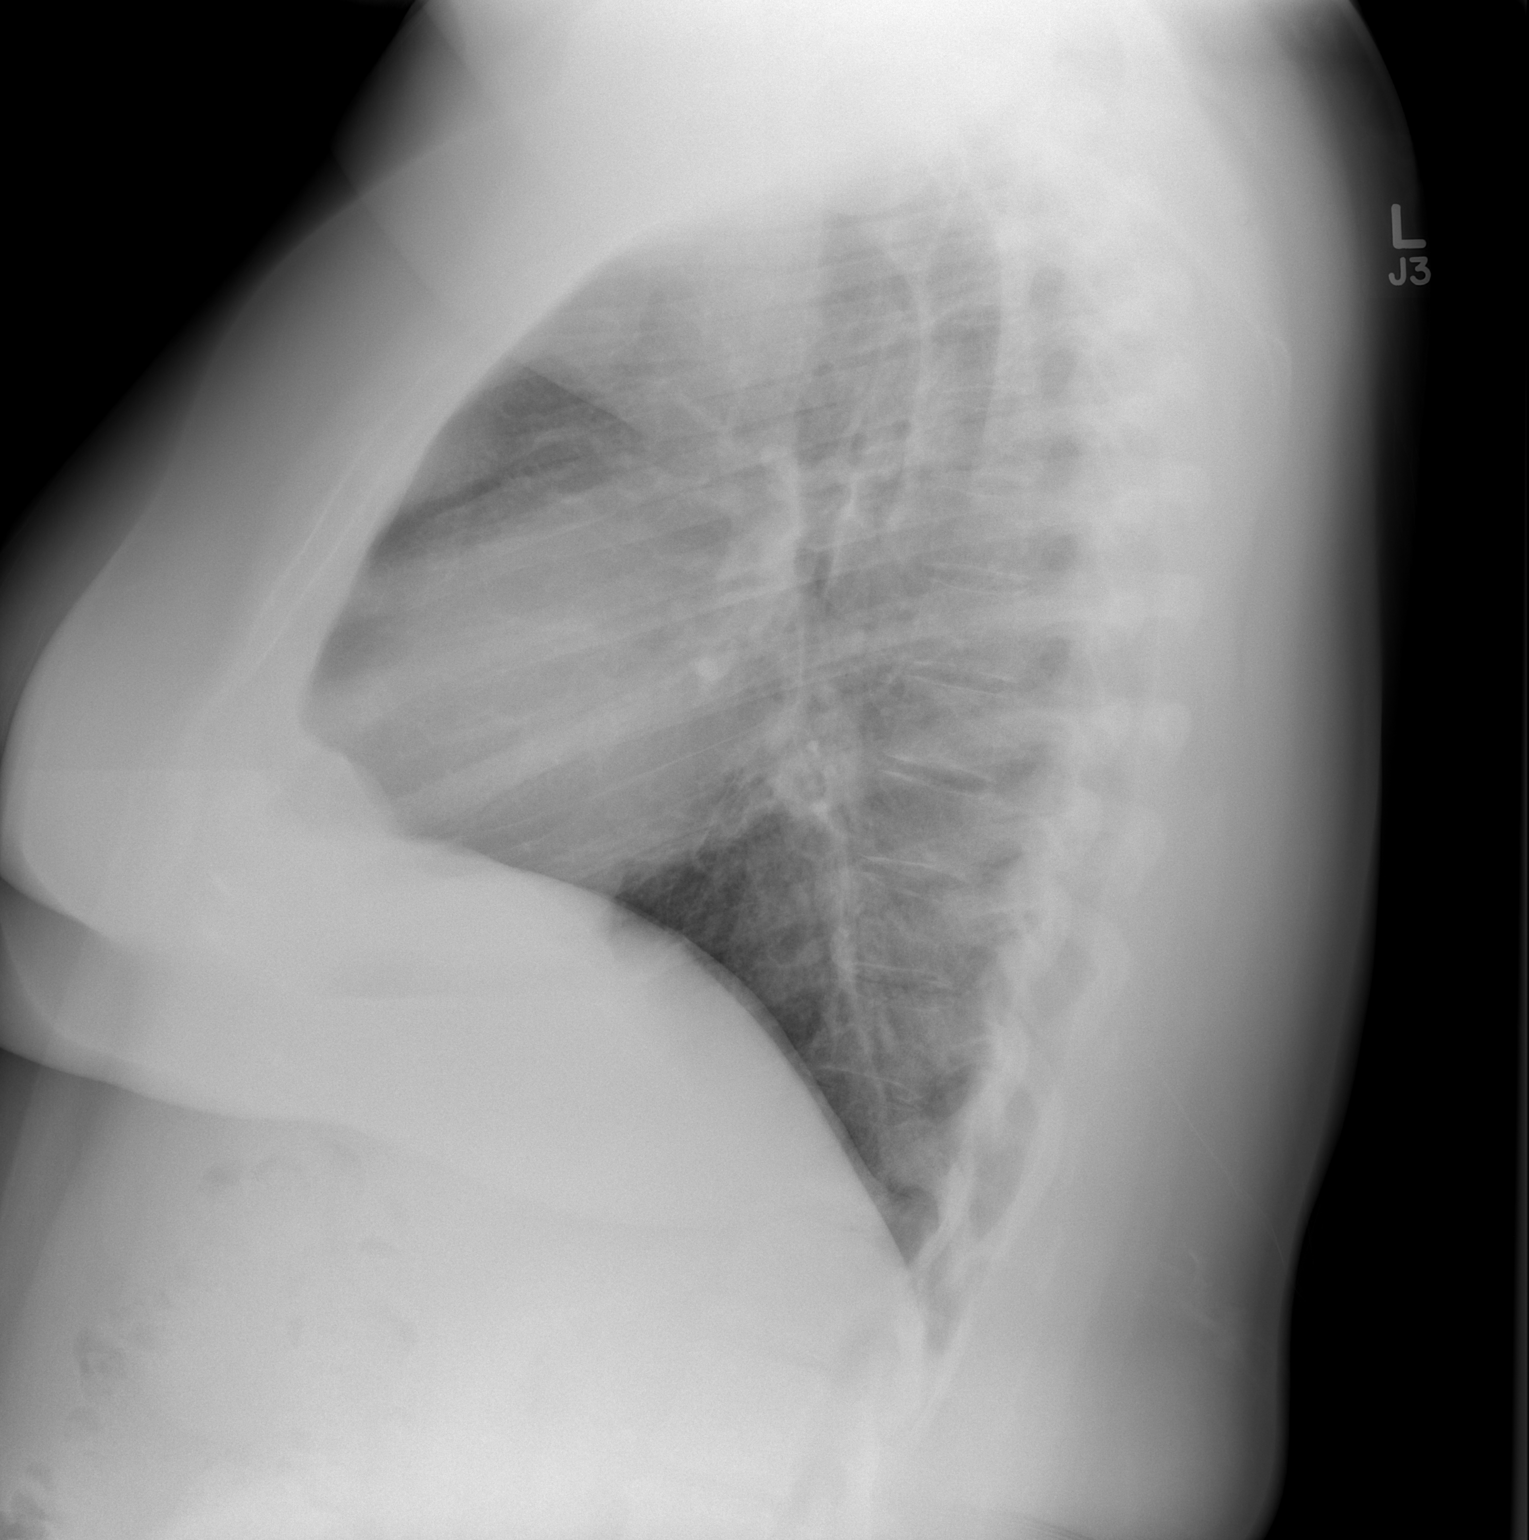

[2 of 2 positions shown; findings below may reference images not displayed]

FINDINGS: The lungs are adequately inflated and clear. The heart and
mediastinal structures are normal. There is no pleural effusion. The
bony thorax is unremarkable.
IMPRESSION: There is no active cardiopulmonary disease.

## 2016-05-25 ENCOUNTER — Other Ambulatory Visit: Payer: BLUE CROSS/BLUE SHIELD

## 2016-05-25 ENCOUNTER — Telehealth: Payer: Self-pay

## 2016-05-25 ENCOUNTER — Encounter: Payer: Self-pay | Admitting: Gynecologic Oncology

## 2016-05-25 ENCOUNTER — Ambulatory Visit: Payer: BLUE CROSS/BLUE SHIELD | Attending: Gynecologic Oncology | Admitting: Gynecologic Oncology

## 2016-05-25 VITALS — BP 134/62 | HR 86 | Temp 97.8°F | Resp 18 | Ht 68.0 in | Wt 358.0 lb

## 2016-05-25 DIAGNOSIS — Z87891 Personal history of nicotine dependence: Secondary | ICD-10-CM | POA: Diagnosis not present

## 2016-05-25 DIAGNOSIS — Z90710 Acquired absence of both cervix and uterus: Secondary | ICD-10-CM | POA: Diagnosis not present

## 2016-05-25 DIAGNOSIS — N029 Recurrent and persistent hematuria with unspecified morphologic changes: Secondary | ICD-10-CM

## 2016-05-25 DIAGNOSIS — C541 Malignant neoplasm of endometrium: Secondary | ICD-10-CM

## 2016-05-25 DIAGNOSIS — R3129 Other microscopic hematuria: Secondary | ICD-10-CM | POA: Diagnosis not present

## 2016-05-25 LAB — URINALYSIS, MICROSCOPIC - CHCC
Bilirubin (Urine): NEGATIVE
Blood: NEGATIVE
Glucose: NEGATIVE mg/dL
Ketones: NEGATIVE mg/dL
LEUKOCYTE ESTERASE: NEGATIVE
NITRITE: NEGATIVE
Protein: NEGATIVE mg/dL
RBC / HPF: NEGATIVE (ref 0–2)
Specific Gravity, Urine: 1.01 (ref 1.003–1.035)
UROBILINOGEN UR: 0.2 mg/dL (ref 0.2–1)
pH: 6.5 (ref 4.6–8.0)

## 2016-05-25 NOTE — Telephone Encounter (Signed)
Spoke with Stacey Arroyo and told her that Dr. Denman George said that Dr. Louis Meckel did not need a CT Scan prior to seeing her as a new patient.  He will see her fist and decide on further testing. Cancelled appointment at [facility] tomorrow at 1015 for chemistries for kidney for possible CT scan. Will call Stacey Arroyo with appointment for Urologist when notified by Dr. Carlton Adam office. Pt will call back to follow up urology appointment next Wednesday if no appointment is scheduled by that time.

## 2016-05-25 NOTE — Patient Instructions (Signed)
Dr. Denman George will call you with an appointment for Urologist Dr. Louis Meckel and a CT scan when appointments obtained.  An appointment needs to be set up for labs at Dr. Serita Grit office to check kidney function. Follow up in a year with Dr. Rachael Fee and PCP in 6 months.

## 2016-05-25 NOTE — Progress Notes (Signed)
GYN ONC FOLLOWUP VISIT  Assessment:    41 y.o. 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   S/p robotic hysterectomy, BSO, sentinel lymph node biopsy on 06/23/14. no LVSI, 15% myometrial invasion, negative pelvic washings and negative lymph nodes.  No apparent recurrence on today's exam.  Microscopic hematuria - unexplained  Plan: 1) Return to clinic in 6 months to see Dr Toney Rakes and myself in 12 months. 2) workup for hematuria - consult with Dr Louis Meckel from [facility].  HPI:  Stacey Arroyo is a 41 y.o. year old G2P2002 initially seen in consultation referred by Dr Toney Rakes for endometrial cancer.  She then underwent a robotic hysterectomy, BSO and sentinel lymph node biopsy on 03/27/47 without complications.  Her postoperative course was uncomplicated.  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 MMR protein testing (for possible lynch syndrome) was negative.  The patient experienced postop pelvic pressure, and CT abdo/pelvis in February, 2017 was unremarkable for space occupying lesion/collection. No masses were seen.   Interval Hx: She had been doing well in the past year, however in the past week began feeling bladder "pressure". Thought it was maybe a UTI. [facility] with bactrim, however, microscopic UA showed large amount of blood. Culture (taken prior to bactrim) was negative for growth.  She denies vaginal bleeding.  No Known Allergies  Current Outpatient Prescriptions on File Prior to Visit  Medication Sig Dispense Refill  . DULoxetine (CYMBALTA) 30 MG capsule Take by mouth.    . estradiol (ESTRACE) 0.5 MG tablet Take 1 tablet (0.5 mg total) by mouth daily. 30 tablet 11  . gabapentin (NEURONTIN) 300 MG capsule Take 300 mg by mouth 2 (two) times daily.     Marland Kitchen gabapentin  (NEURONTIN) 300 MG capsule Take 600 mg by mouth 3 (three) times daily.  6  . lidocaine (LIDODERM) 5 % Reported on 04/28/2015    . lisinopril (PRINIVIL,ZESTRIL) 20 MG tablet Take 20 mg by mouth every morning.     . meloxicam (MOBIC) 15 MG tablet Take 15 mg by mouth every evening.     . ondansetron (ZOFRAN ODT) 4 MG disintegrating tablet Take 1 tablet (4 mg total) by mouth every 8 (eight) hours as needed for nausea or vomiting. (Patient not taking: Reported on 04/28/2015) 20 tablet 0  . oxyCODONE-acetaminophen (PERCOCET) 7.5-325 MG per tablet Reported on 04/28/2015    . sulfamethoxazole-trimethoprim (BACTRIM DS,SEPTRA DS) 800-160 MG tablet Take 1 tablet by mouth 2 (two) times daily. 6 tablet 0   No current facility-administered medications on file prior to visit.     Past Medical History:  Diagnosis Date  . Cancer [facility])    endometrial  . Osteoarthritis     Past Surgical History:  Procedure Laterality Date  . BREAST REDUCTION SURGERY    . CESAREAN SECTION     x 2  . FOOT SURGERY Right   . JOINT REPLACEMENT Left 12/15   total knee  . KNEE SURGERY Left    scope, acl x 6  . KNEE SURGERY Right    scope with partial acl  . ROBOTIC ASSISTED TOTAL HYSTERECTOMY WITH BILATERAL SALPINGO OOPHERECTOMY N/A 06/23/2014   Procedure: ROBOTIC ASSISTED TOTAL HYSTERECTOMY WITH BILATERAL SALPINGO OOPHORECTOMY/SENTINAL LYMPHNODE LEFT PELVIC LYMPH BIOPSY;  Surgeon: Everitt Amber, MD;  Location: [facility];  Service: Gynecology;  Laterality:  N/A;  . TONSILLECTOMY      Social History   Social History  . Marital status: Married    Spouse name: N/A  . Number of children: N/A  . Years of education: N/A   Occupational History  . Not on file.   Social History Main Topics  . Smoking status: Former Smoker    Packs/day: 0.25    Years: 2.00    Quit date: 06/17/1998  . Smokeless tobacco: Never Used  . Alcohol use No  . Drug use: No  . Sexual activity: Yes    Birth control/ protection: Condom   Other Topics  Concern  . Not on file   Social History Narrative  . No narrative on file    Family History  Problem Relation Age of Onset  . Diabetes Father       Review of systems: Constitutional:  She has no weight gain or weight loss. She has no fever or chills. Eyes: No blurred vision Ears, Nose, Mouth, Throat: No dizziness, headaches or changes in hearing. No mouth sores. Cardiovascular: No chest pain, palpitations or edema. Respiratory:  No shortness of breath, wheezing or cough Gastrointestinal: She has normal bowel movements without diarrhea or constipation. She denies any nausea or vomiting. She denies blood in her stool or heart burn. Genitourinary:  She denies pelvic pain, pelvic pressure or changes in her urinary function. She has no hematuria, dysuria, or incontinence. She has no irregular vaginal bleeding or vaginal discharge Musculoskeletal: Denies muscle weakness or joint pains.  Skin:  She has no skin changes, rashes or itching Neurological:  Denies dizziness or headaches. No neuropathy, no numbness or tingling. Psychiatric:  She denies depression or anxiety. Hematologic/Lymphatic:   No easy bruising or bleeding   Physical Exam: There were no vitals taken for this visit. General: Well dressed, well nourished in no apparent distress.   HEENT:  Normocephalic and atraumatic, no lesions.  Extraocular muscles intact. Sclerae anicteric. Pupils equal, round, reactive. No mouth sores or ulcers. Thyroid is normal size, not nodular, midline. Lungs:  Clear to auscultation bilaterally.  No wheezes. Cardiovascular:  Regular rate and rhythm.  No murmurs or rubs. Abdomen:  Soft, nontender, nondistended.  No palpable masses.  No hepatosplenomegaly.  No ascites. Normal bowel sounds.  No hernias.  Genitourinary: Normal EGBUS  Vaginal cuff intact.  No bleeding or discharge.  No cul de sac fullness. Extremities: No cyanosis, clubbing or edema.  No calf tenderness or erythema. No palpable  cords. Psychiatric: Mood and affect are appropriate. Neurological: Awake, alert and oriented x 3. Sensation is intact, no neuropathy.  Musculoskeletal: No pain, normal strength and range of motion.   [name], MD

## 2016-05-26 ENCOUNTER — Telehealth: Payer: Self-pay | Admitting: *Deleted

## 2016-05-26 ENCOUNTER — Other Ambulatory Visit: Payer: BLUE CROSS/BLUE SHIELD

## 2016-05-26 LAB — URINE CULTURE

## 2016-05-26 NOTE — Telephone Encounter (Signed)
Pt records faxed to [facility]. Gwen advised pt has an appt with Dr. Burman Nieves on 3/15  Pt has been notified.

## 2016-05-29 ENCOUNTER — Telehealth: Payer: Self-pay

## 2016-05-29 NOTE — Telephone Encounter (Signed)
-----   Message from Everitt Amber, MD sent at 05/29/2016  3:35 PM EDT ----- No evidence of UTI on culture.

## 2016-05-29 NOTE — Telephone Encounter (Signed)
Told Stacey Arroyo that the urine culture was negative for an infection as noted belwo by Dr. Denman George.  UA was also negative for blood. Copy of Urine Culture results sent to Dr. Louis Meckel for appointment 06-01-16~1230.

## 2016-06-21 ENCOUNTER — Other Ambulatory Visit: Payer: Self-pay

## 2016-06-21 DIAGNOSIS — C541 Malignant neoplasm of endometrium: Secondary | ICD-10-CM

## 2016-06-21 MED ORDER — ESTRADIOL 0.5 MG PO TABS: 0.5000 mg | ORAL_TABLET | Freq: Every day | ORAL | 11 refills | Status: DC

## 2016-08-02 ENCOUNTER — Encounter: Payer: Self-pay | Admitting: Gynecology

## 2017-02-03 IMAGING — CT CT ABD-PELV W/ CM
2 of 5 series · 17 of 46 positions shown, 19 images · IV contrast (OMNIPAQUE)
Comparison: None.

CLINICAL DATA: Endometrial carcinoma. Previous hysterectomy.
Worsening lower abdominal and pelvic pressure for 1 week.

EXAM:
CT ABDOMEN AND PELVIS WITH CONTRAST
TECHNIQUE: Multidetector CT imaging of the abdomen and pelvis was performed
using the standard protocol following bolus administration of
intravenous contrast.
CONTRAST:  100mL OMNIPAQUE IOHEXOL 300 MG/ML  SOLN

[Series 2: rtn a/p with · axial · 0.96mm/px · z∈[-570,-150]mm · 14 of 96 slices shown, 16 images]
[im 6/96  soft-tissue]
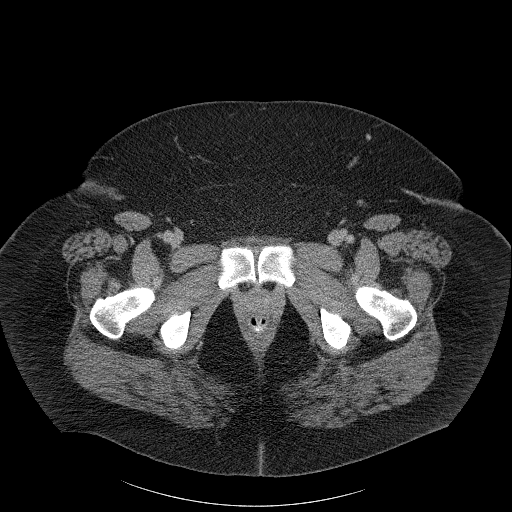
[im 6/96  bone]
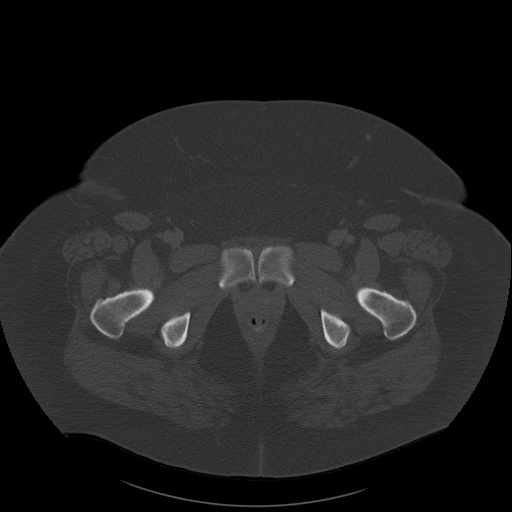
[im 11/96  soft-tissue]
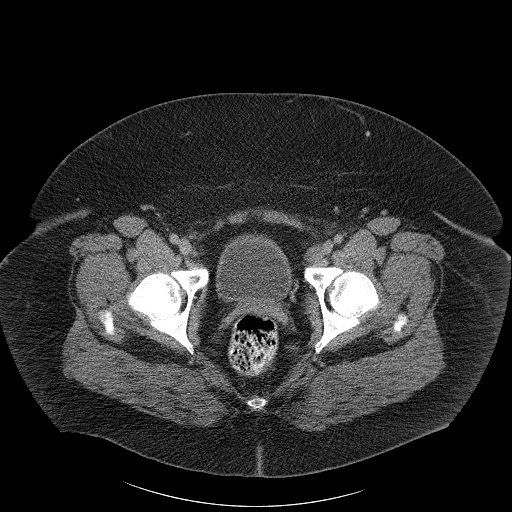
[im 22/96  soft-tissue]
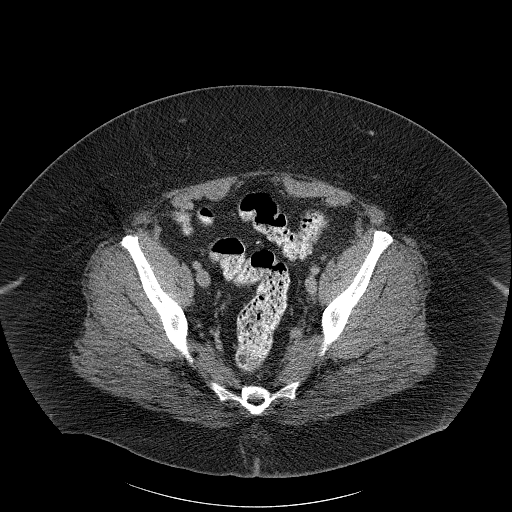
[im 27/96  soft-tissue]
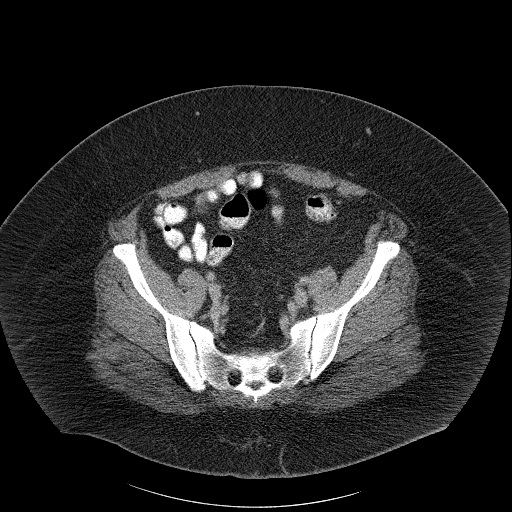
[im 32/96  soft-tissue]
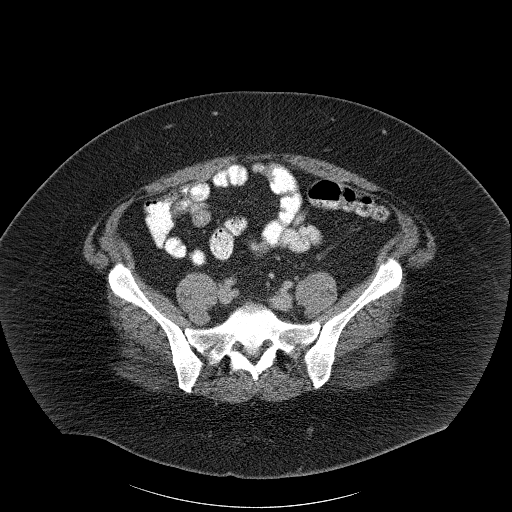
[im 37/96  soft-tissue]
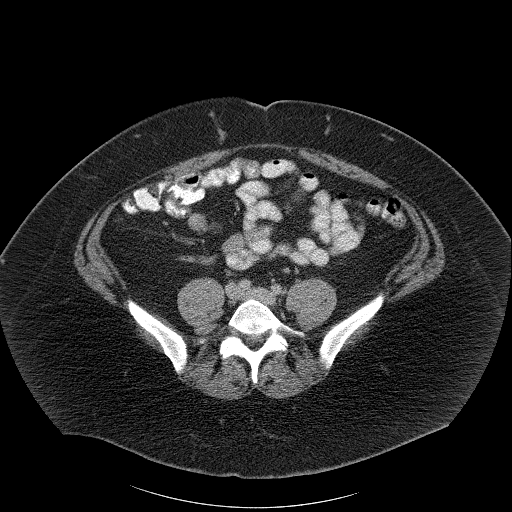
[im 43/96  soft-tissue]
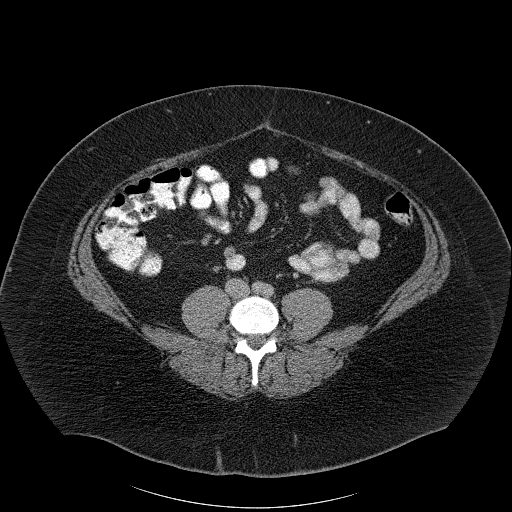
[im 53/96  soft-tissue]
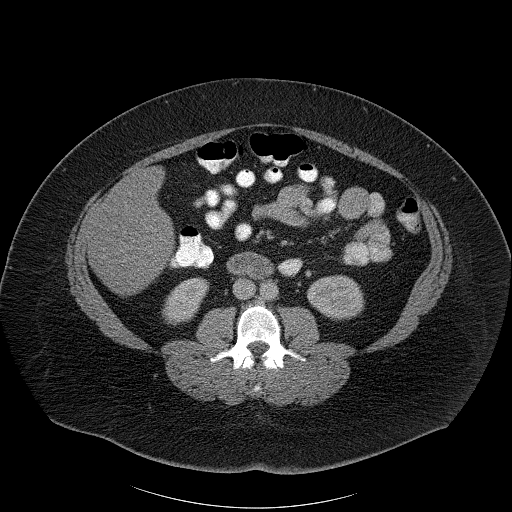
[im 59/96  soft-tissue]
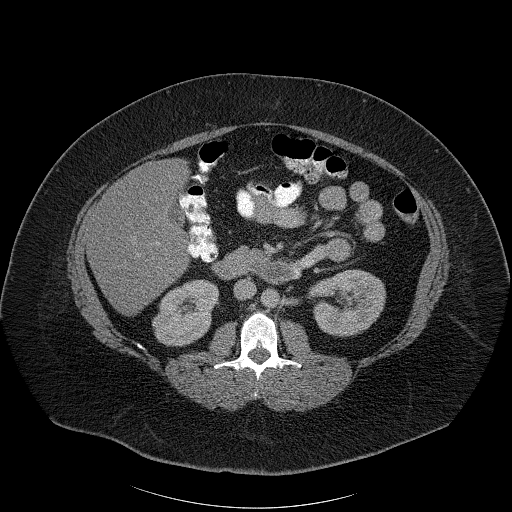
[im 59/96  bone]
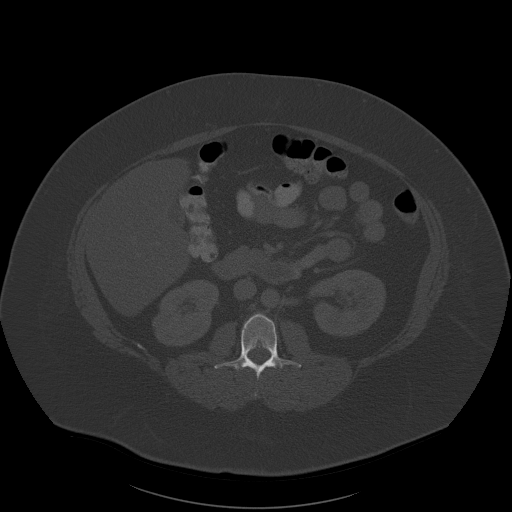
[im 64/96  soft-tissue]
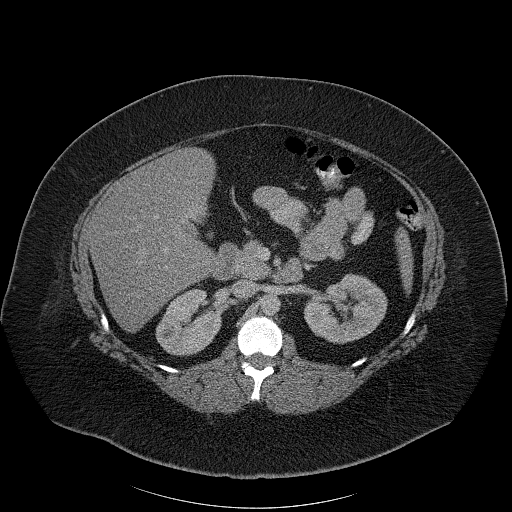
[im 69/96  soft-tissue]
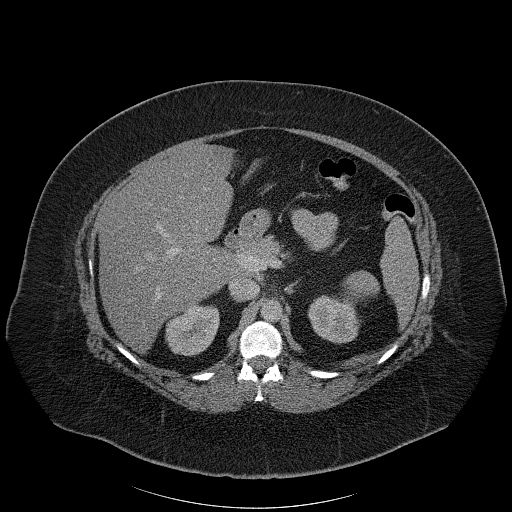
[im 74/96  soft-tissue]
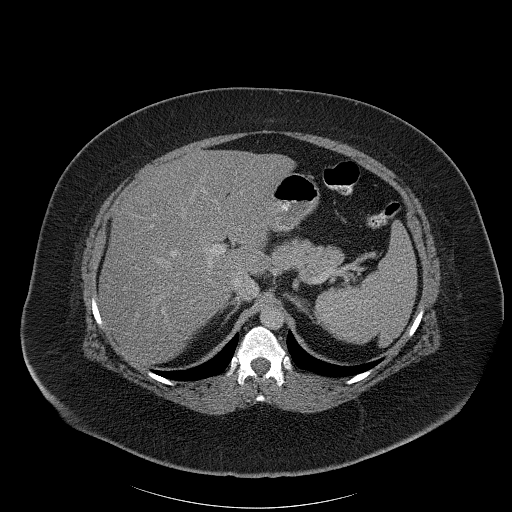
[im 85/96  soft-tissue]
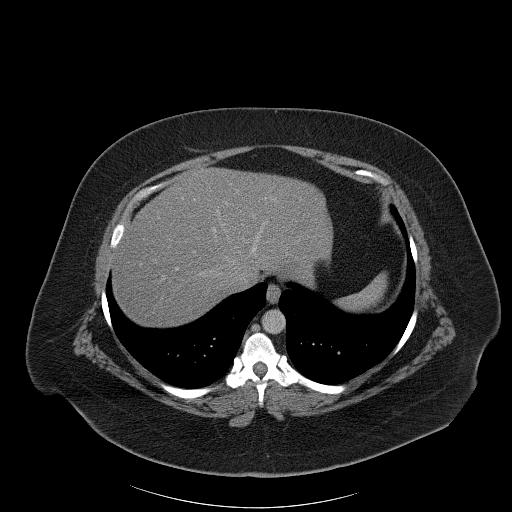
[im 90/96  soft-tissue]
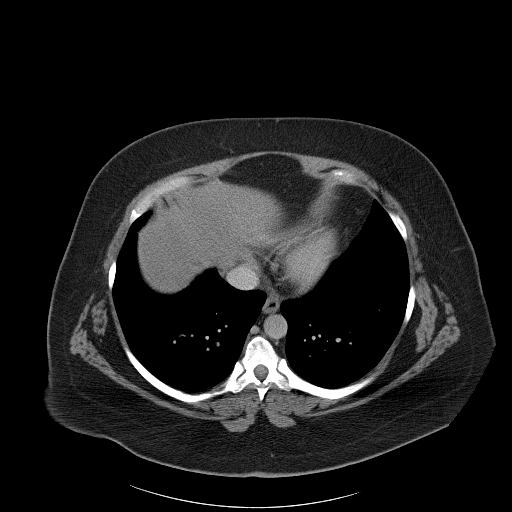

[Series 602: <mpr thick range> · coronal · 0.96mm/px · 3 of 120 slices shown]
[im 40/120  soft-tissue]
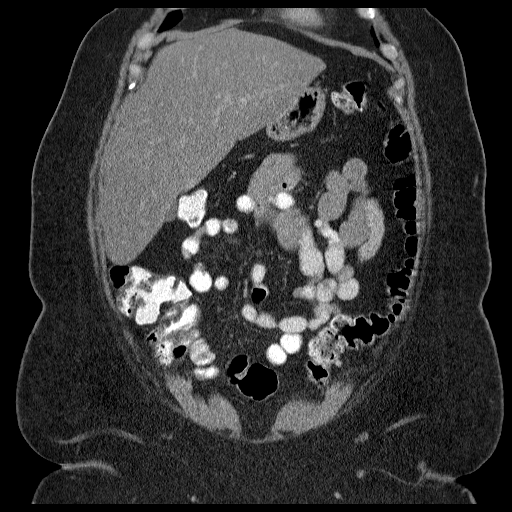
[im 53/120  soft-tissue]
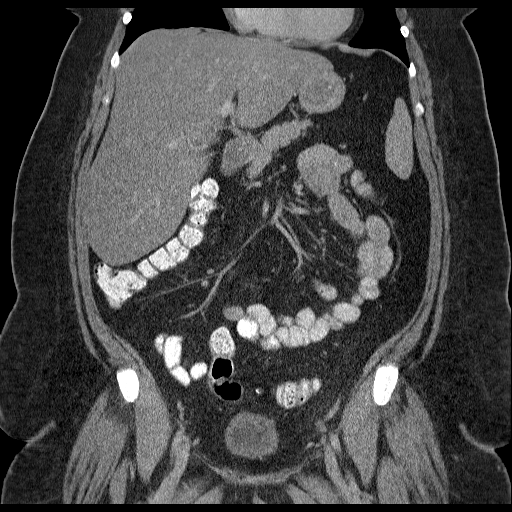
[im 67/120  soft-tissue]
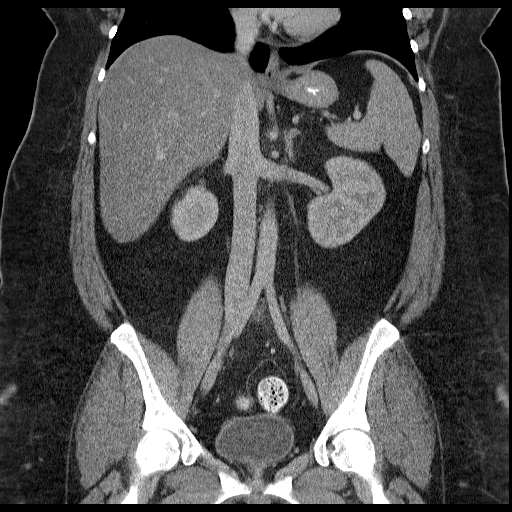

[17 of 46 positions shown; findings below may reference images not displayed]

FINDINGS: Lower chest:  No acute findings.

Hepatobiliary: Diffuse hepatic steatosis noted. No liver masses
identified. Gallbladder is unremarkable.

Pancreas: No mass, inflammatory changes, or other significant
abnormality.

Spleen: Within normal limits in size and appearance.

Adrenals/Urinary Tract: No masses identified. No evidence of
hydronephrosis. Several tiny less than 1 cm nonobstructive renal
calculi noted bilaterally.

Stomach/Bowel: No evidence of obstruction, inflammatory process, or
abnormal fluid collections. Normal appendix visualized.

Vascular/Lymphatic: No pathologically enlarged lymph nodes. No
evidence of abdominal aortic aneurysm.

Reproductive: Prior hysterectomy noted. Adnexal regions are
unremarkable in appearance.

Other: None.

Musculoskeletal:  No suspicious bone lesions identified.
IMPRESSION: No evidence of recurrent or metastatic carcinoma within the abdomen
or pelvis.

Small nonobstructive bilateral renal calculi. No evidence of
hydronephrosis or other acute findings.

Diffuse hepatic steatosis.

## 2017-09-13 ENCOUNTER — Telehealth: Payer: Self-pay

## 2017-09-13 NOTE — Telephone Encounter (Signed)
Per Barbaraann Share RN, there is a refill request from [facility]  - outgoing call at this time to patient in reference to the refill request for Estradiol.  Pt reports she has been seeing her PCP but that she would like to make an appt with Dr Denman George. (Per Dr Denman George notes from Mar 2018 visit:  Return to clinic in 6 months to see Dr Toney Rakes and myself in 12 months).  Set pt's appt for July 10 th at 10:45 am. Pt agreeable and said she has enough of her estradiol and will discuss further with Dr Denman George at the appt.  No other needs per pt as this time.

## 2017-09-26 ENCOUNTER — Inpatient Hospital Stay: Payer: BLUE CROSS/BLUE SHIELD | Attending: Gynecologic Oncology | Admitting: Gynecologic Oncology

## 2017-09-26 ENCOUNTER — Encounter: Payer: Self-pay | Admitting: Gynecologic Oncology

## 2017-09-26 VITALS — BP 107/63 | HR 81 | Temp 98.2°F | Resp 20 | Ht 68.0 in | Wt 346.5 lb

## 2017-09-26 DIAGNOSIS — Z90710 Acquired absence of both cervix and uterus: Secondary | ICD-10-CM | POA: Diagnosis not present

## 2017-09-26 DIAGNOSIS — Z87891 Personal history of nicotine dependence: Secondary | ICD-10-CM | POA: Diagnosis not present

## 2017-09-26 DIAGNOSIS — Z90722 Acquired absence of ovaries, bilateral: Secondary | ICD-10-CM | POA: Insufficient documentation

## 2017-09-26 DIAGNOSIS — C541 Malignant neoplasm of endometrium: Secondary | ICD-10-CM | POA: Diagnosis present

## 2017-09-26 DIAGNOSIS — E8940 Asymptomatic postprocedural ovarian failure: Secondary | ICD-10-CM

## 2017-09-26 DIAGNOSIS — E8941 Symptomatic postprocedural ovarian failure: Secondary | ICD-10-CM | POA: Diagnosis not present

## 2017-09-26 MED ORDER — ESTRADIOL 0.5 MG PO TABS: 1.0000 mg | ORAL_TABLET | Freq: Every day | ORAL | 11 refills | Status: DC

## 2017-09-26 NOTE — Patient Instructions (Signed)
Please notify Dr Denman George at phone number [phone] if you notice vaginal bleeding, new pelvic or abdominal pains, bloating, feeling full easy, or a change in bladder or bowel function.   Please call the above number in December to schedule a January appointment with Joylene John for a gynecologic exam.

## 2017-09-26 NOTE — Progress Notes (Signed)
GYN ONC FOLLOWUP VISIT  Assessment:    42 y.o. 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   S/p robotic hysterectomy, BSO, sentinel lymph node biopsy on 06/23/14. no LVSI, 15% myometrial invasion, negative pelvic washings and negative lymph nodes.  No apparent recurrence on today's exam.  Symptomatic menopause - increase estradiol dose to '1mg'$ .   Plan: 1) Return to clinic in 6 months to see Joylene John (as her OBGYN has retired) and myself in 12 months. 2) workup for hematuria - consult with Dr Louis Meckel from [facility].  HPI:  Stacey Arroyo is a 42 y.o. year old G2P2002 initially seen in consultation referred by Dr Toney Rakes for endometrial cancer.  She then underwent a robotic hysterectomy, BSO and sentinel lymph node biopsy on 08/22/31 without complications.  Her postoperative course was uncomplicated.  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 MMR protein testing (for possible lynch syndrome) was negative.  The patient experienced postop pelvic pressure, and CT abdo/pelvis in February, 2017 was unremarkable for space occupying lesion/collection. No masses were seen.   Interval Hx: She had microscopic hematuria diagnosed in 2018 and was seen by Dr Louis Meckel. A CT scan showed kidney stones.  She denies vaginal bleeding.  She reports worsening of menopausal symptoms not improved with venlafaxine. She is on 0.'5mg'$  estradiol daily.   No Known Allergies  Current Outpatient Medications on File Prior to Visit  Medication Sig Dispense Refill  . acetaminophen (TYLENOL) 325 MG tablet Take 500 mg by mouth as needed. Patient takes medication as needed for pain.    Marland Kitchen losartan-hydrochlorothiazide (HYZAAR) 100-12.5 MG tablet Take 1 tablet by mouth daily.  6  . metFORMIN (GLUCOPHAGE-XR) 500 MG 24 hr tablet TAKE ONE TABLET (500 MG TOTAL)  BY MOUTH WITH BREAKFAST.  6  . venlafaxine XR (EFFEXOR-XR) 75 MG 24 hr capsule Take by mouth.     No current facility-administered medications on file prior to visit.     Past Medical History:  Diagnosis Date  . Cancer [facility])    endometrial  . Osteoarthritis     Past Surgical History:  Procedure Laterality Date  . BREAST REDUCTION SURGERY    . CESAREAN SECTION     x 2  . FOOT SURGERY Right   . JOINT REPLACEMENT Left 12/15   total knee  . KNEE SURGERY Left    scope, acl x 6  . KNEE SURGERY Right    scope with partial acl  . ROBOTIC ASSISTED TOTAL HYSTERECTOMY WITH BILATERAL SALPINGO OOPHERECTOMY N/A 06/23/2014   Procedure: ROBOTIC ASSISTED TOTAL HYSTERECTOMY WITH BILATERAL SALPINGO OOPHORECTOMY/SENTINAL LYMPHNODE LEFT PELVIC LYMPH BIOPSY;  Surgeon: Everitt Amber, MD;  Location: [facility];  Service: Gynecology;  Laterality: N/A;  . TONSILLECTOMY      Social History   Socioeconomic History  . Marital status: Married    Spouse name: Not on file  . Number of children: Not on file  . Years of education: Not on file  . Highest education level: Not on file  Occupational History  . Not on file  Social Needs  . Financial resource strain: Not on file  . Food insecurity:    Worry: Not on file    Inability: Not on file  . Transportation needs:    Medical: Not on file    Non-medical: Not on file  Tobacco Use  . Smoking status:  Former Smoker    Packs/day: 0.25    Years: 2.00    Pack years: 0.50    Last attempt to quit: 06/17/1998    Years since quitting: 19.2  . Smokeless tobacco: Never Used  Substance and Sexual Activity  . Alcohol use: No    Alcohol/week: 0.0 oz  . Drug use: No  . Sexual activity: Yes    Birth control/protection: Condom  Lifestyle  . Physical activity:    Days per week: Not on file    Minutes per session: Not on file  . Stress: Not on file  Relationships  . Social connections:    Talks on phone: Not on file    Gets together: Not on file    Attends  religious service: Not on file    Active member of club or organization: Not on file    Attends meetings of clubs or organizations: Not on file    Relationship status: Not on file  . Intimate partner violence:    Fear of current or ex partner: Not on file    Emotionally abused: Not on file    Physically abused: Not on file    Forced sexual activity: Not on file  Other Topics Concern  . Not on file  Social History Narrative  . Not on file    Family History  Problem Relation Age of Onset  . Diabetes Father       Review of systems: Constitutional:  She has no weight gain or weight loss. She has no fever or chills. + hotflashes and sleep disturbance Eyes: No blurred vision Ears, Nose, Mouth, Throat: No dizziness, headaches or changes in hearing. No mouth sores. Cardiovascular: No chest pain, palpitations or edema. Respiratory:  No shortness of breath, wheezing or cough Gastrointestinal: She has normal bowel movements without diarrhea or constipation. She denies any nausea or vomiting. She denies blood in her stool or heart burn. Genitourinary:  She denies pelvic pain, pelvic pressure or changes in her urinary function. She has no hematuria, dysuria, or incontinence. She has no irregular vaginal bleeding or vaginal discharge Musculoskeletal: Denies muscle weakness or joint pains.  Skin:  She has no skin changes, rashes or itching Neurological:  Denies dizziness or headaches. No neuropathy, no numbness or tingling. Psychiatric:  She denies depression or anxiety. Hematologic/Lymphatic:   No easy bruising or bleeding   Physical Exam: Blood pressure 107/63, pulse 81, temperature 98.2 F (36.8 C), temperature source Oral, resp. rate 20, height '5\' 8"'$  (1.727 m), weight (!) 346 lb 8 oz (157.2 kg), SpO2 98 %. General: Well dressed, well nourished in no apparent distress.   HEENT:  Normocephalic and atraumatic, no lesions.  Extraocular muscles intact. Sclerae anicteric. Pupils equal, round,  reactive. No mouth sores or ulcers. Thyroid is normal size, not nodular, midline. Lungs:  Deferred Cardiovascular:  deferred Abdomen:  Soft, nontender, nondistended.  No palpable masses.  No hepatosplenomegaly.  No ascites. Normal bowel sounds.  No hernias.  Genitourinary: Normal EGBUS  Vaginal cuff intact.  No bleeding or discharge.  No cul de sac fullness. Extremities: No cyanosis, clubbing or edema.  No calf tenderness or erythema. No palpable cords. Psychiatric: Mood and affect are appropriate. Neurological: Awake, alert and oriented x 3. Sensation is intact, no neuropathy.  Musculoskeletal: No pain, normal strength and range of motion.   Thereasa Solo, MD

## 2018-07-24 ENCOUNTER — Encounter: Payer: Self-pay | Admitting: Gynecologic Oncology

## 2018-07-24 NOTE — Progress Notes (Signed)
Refill for estradiol faxed back to [facility] for three month supply but patient will need appt for follow up for any additional refills.

## 2018-09-09 ENCOUNTER — Telehealth: Payer: Self-pay

## 2018-09-09 NOTE — Telephone Encounter (Signed)
LM for Stacey Arroyo that Joylene John, NP said that she will need to be see prior to any further refills of her estradiol. She was due for a visit in 03-2018. She can call to the office to schedule an appointment.  The next available appointment day is 09-17-18 in the am.

## 2018-09-10 NOTE — Telephone Encounter (Signed)
Pt scheduled for 09-17-18 to see Joylene John, NP.

## 2018-09-17 ENCOUNTER — Encounter: Payer: Self-pay | Admitting: Gynecologic Oncology

## 2018-09-17 ENCOUNTER — Inpatient Hospital Stay: Payer: BC Managed Care – PPO | Attending: Gynecologic Oncology | Admitting: Gynecologic Oncology

## 2018-09-17 ENCOUNTER — Other Ambulatory Visit: Payer: Self-pay

## 2018-09-17 VITALS — BP 129/82 | HR 84 | Temp 98.7°F | Resp 20 | Ht 68.0 in | Wt 346.0 lb

## 2018-09-17 DIAGNOSIS — N951 Menopausal and female climacteric states: Secondary | ICD-10-CM

## 2018-09-17 DIAGNOSIS — K760 Fatty (change of) liver, not elsewhere classified: Secondary | ICD-10-CM

## 2018-09-17 DIAGNOSIS — R160 Hepatomegaly, not elsewhere classified: Secondary | ICD-10-CM

## 2018-09-17 DIAGNOSIS — Z90710 Acquired absence of both cervix and uterus: Secondary | ICD-10-CM | POA: Diagnosis not present

## 2018-09-17 DIAGNOSIS — M25561 Pain in right knee: Secondary | ICD-10-CM

## 2018-09-17 DIAGNOSIS — Z87891 Personal history of nicotine dependence: Secondary | ICD-10-CM

## 2018-09-17 DIAGNOSIS — Z87442 Personal history of urinary calculi: Secondary | ICD-10-CM

## 2018-09-17 DIAGNOSIS — M1990 Unspecified osteoarthritis, unspecified site: Secondary | ICD-10-CM | POA: Diagnosis not present

## 2018-09-17 DIAGNOSIS — Z833 Family history of diabetes mellitus: Secondary | ICD-10-CM | POA: Diagnosis not present

## 2018-09-17 DIAGNOSIS — R232 Flushing: Secondary | ICD-10-CM | POA: Diagnosis not present

## 2018-09-17 DIAGNOSIS — Z79899 Other long term (current) drug therapy: Secondary | ICD-10-CM | POA: Diagnosis not present

## 2018-09-17 DIAGNOSIS — C541 Malignant neoplasm of endometrium: Secondary | ICD-10-CM | POA: Insufficient documentation

## 2018-09-17 DIAGNOSIS — M25562 Pain in left knee: Secondary | ICD-10-CM

## 2018-09-17 DIAGNOSIS — G4700 Insomnia, unspecified: Secondary | ICD-10-CM | POA: Diagnosis not present

## 2018-09-17 DIAGNOSIS — N200 Calculus of kidney: Secondary | ICD-10-CM | POA: Diagnosis not present

## 2018-09-17 MED ORDER — VENLAFAXINE HCL ER 150 MG PO CP24: 150.0000 mg | ORAL_CAPSULE | Freq: Every day | ORAL | 6 refills | Status: DC

## 2018-09-17 NOTE — Patient Instructions (Signed)
No evidence of cancer on today's exam.  Plan to follow up in one year or sooner if needed.  Please call our office closer to the date to schedule.  We will plan to increase your effexor to 150 mg extended release once daily to see if your hot flash symptoms improve.  If you are unable to tolerate this medication, please call the office to discuss other options. Let our office know right away if you start having any thought of harming yourself or others since this medication has this as a rare side effect.  Venlafaxine extended-release capsules  What is this medicine? VENLAFAXINE(VEN la fax een) is used to treat depression, anxiety and panic disorder. This medicine may be used for other purposes; ask your health care provider or pharmacist if you have questions. COMMON BRAND NAME(S): Effexor XR What should I tell my health care provider before I take this medicine? They need to know if you have any of these conditions:  bleeding disorders  glaucoma  heart disease  high blood pressure  high cholesterol  kidney disease  liver disease  low levels of sodium in the blood  mania or bipolar disorder  seizures  suicidal thoughts, plans, or attempt; a previous suicide attempt by you or a family  take medicines that treat or prevent blood clots  thyroid disease  an unusual or allergic reaction to venlafaxine, desvenlafaxine, other medicines, foods, dyes, or preservatives  pregnant or trying to get pregnant  breast-feeding How should I use this medicine? Take this medicine by mouth with a full glass of water. Follow the directions on the prescription label. Do not cut, crush, or chew this medicine. Take it with food. If needed, the capsule may be carefully opened and the entire contents sprinkled on a spoonful of cool applesauce. Swallow the applesauce/pellet mixture right away without chewing and follow with a glass of water to ensure complete swallowing of the pellets. Try to take  your medicine at about the same time each day. Do not take your medicine more often than directed. Do not stop taking this medicine suddenly except upon the advice of your doctor. Stopping this medicine too quickly may cause serious side effects or your condition may worsen. A special MedGuide will be given to you by the pharmacist with each prescription and refill. Be sure to read this information carefully each time. Talk to your pediatrician regarding the use of this medicine in children. Special care may be needed. Overdosage: If you think you have taken too much of this medicine contact a poison control center or emergency room at once. NOTE: This medicine is only for you. Do not share this medicine with others. What if I miss a dose? If you miss a dose, take it as soon as you can. If it is almost time for your next dose, take only that dose. Do not take double or extra doses. What may interact with this medicine? Do not take this medicine with any of the following medications:  certain medicines for fungal infections like fluconazole, itraconazole, ketoconazole, posaconazole, voriconazole  cisapride  desvenlafaxine  dronedarone  duloxetine  levomilnacipran  linezolid  MAOIs like Carbex, Eldepryl, Marplan, Nardil, and Parnate  methylene blue (injected into a vein)  milnacipran  pimozide  thioridazine This medicine may also interact with the following medications:  amphetamines  aspirin and aspirin-like medicines  certain medicines for depression, anxiety, or psychotic disturbances  certain medicines for migraine headaches like almotriptan, eletriptan, frovatriptan, naratriptan, rizatriptan, sumatriptan, zolmitriptan  certain medicines for sleep  certain medicines that treat or prevent blood clots like dalteparin, enoxaparin, warfarin  cimetidine  clozapine  diuretics  fentanyl  furazolidone  indinavir  isoniazid  lithium  metoprolol  NSAIDS,  medicines for pain and inflammation, like ibuprofen or naproxen  other medicines that prolong the QT interval (cause an abnormal heart rhythm) like dofetilide, ziprasidone  procarbazine  rasagiline  supplements like St. John's wort, kava kava, valerian  tramadol  tryptophan This list may not describe all possible interactions. Give your health care provider a list of all the medicines, herbs, non-prescription drugs, or dietary supplements you use. Also tell them if you smoke, drink alcohol, or use illegal drugs. Some items may interact with your medicine. What should I watch for while using this medicine? Tell your doctor if your symptoms do not get better or if they get worse. Visit your doctor or health care professional for regular checks on your progress. Because it may take several weeks to see the full effects of this medicine, it is important to continue your treatment as prescribed by your doctor. Patients and their families should watch out for new or worsening thoughts of suicide or depression. Also watch out for sudden changes in feelings such as feeling anxious, agitated, panicky, irritable, hostile, aggressive, impulsive, severely restless, overly excited and hyperactive, or not being able to sleep. If this happens, especially at the beginning of treatment or after a change in dose, call your health care professional. This medicine can cause an increase in blood pressure. Check with your doctor for instructions on monitoring your blood pressure while taking this medicine. You may get drowsy or dizzy. Do not drive, use machinery, or do anything that needs mental alertness until you know how this medicine affects you. Do not stand or sit up quickly, especially if you are an older patient. This reduces the risk of dizzy or fainting spells. Alcohol may interfere with the effect of this medicine. Avoid alcoholic drinks. Your mouth may get dry. Chewing sugarless gum, sucking hard candy and  drinking plenty of water will help. Contact your doctor if the problem does not go away or is severe. What side effects may I notice from receiving this medicine? Side effects that you should report to your doctor or health care professional as soon as possible:  allergic reactions like skin rash, itching or hives, swelling of the face, lips, or tongue  anxious  breathing problems  confusion  changes in vision  chest pain  confusion  elevated mood, decreased need for sleep, racing thoughts, impulsive behavior  eye pain  fast, irregular heartbeat  feeling faint or lightheaded, falls  feeling agitated, angry, or irritable  hallucination, loss of contact with reality  high blood pressure  loss of balance or coordination  palpitations  redness, blistering, peeling or loosening of the skin, including inside the mouth  restlessness, pacing, inability to keep still  seizures  stiff muscles  suicidal thoughts or other mood changes  trouble passing urine or change in the amount of urine  trouble sleeping  unusual bleeding or bruising  unusually weak or tired  vomiting Side effects that usually do not require medical attention (report to your doctor or health care professional if they continue or are bothersome):  change in sex drive or performance  change in appetite or weight  constipation  dizziness  dry mouth  headache  increased sweating  nausea  tired This list may not describe all possible side effects. Call  your doctor for medical advice about side effects. You may report side effects to FDA at 1-800-FDA-1088. Where should I keep my medicine? Keep out of the reach of children. Store at a controlled temperature between 20 and 25 degrees C (68 degrees and 77 degrees F), in a dry place. Throw away any unused medicine after the expiration date. NOTE: This sheet is a summary. It may not cover all possible information. If you have questions about  this medicine, talk to your doctor, pharmacist, or health care provider.  2020 Elsevier/Gold Standard (2018-02-26 12:06:43)

## 2018-09-17 NOTE — Progress Notes (Signed)
Follow Up Note: Gyn-Onc  Stacey Arroyo 42 y.o. female  CC:  Chief Complaint  Patient presents with  . Endometrial cancer [facility])    Follow Up    HPI: Stacey Arroyo is a 43 year old female, G2P2, initially seen in consultation at the request of Dr Toney Rakes for endometrial cancer.  She reported abnormal menstruation for at least 2 years. As part of the workup for abnormal uterine bleeding, she underwent an ultrasound scan on 05/29/2014, which revealed a uterus measuring 9.7 x 7.1 x 5.3 cm with a 13 mm endometrial stripe. An endometrial biopsy was performed on the same day which revealed a FIGO grade 1 endometrial adenocarcinoma.  She then underwent a robotic hysterectomy, BSO and sentinel lymph node biopsy on 03/25/53 without complications.  Her postoperative course was uncomplicated.  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 MMR protein testing (for possible lynch syndrome) was negative.  A CT abdo/pelvis was ordered in February, 2017 to evaluate post-op pelvic pressure and was unremarkable for space occupying lesion/collection and no masses were seen.  She also had microscopic hematuria diagnosed in 2018 and was seen by Dr Louis Meckel. A CT scan showed kidney stones.  Interval History: She presents today for endometrial cancer follow up.  She states she has been doing well overall since her last visit.  The changes surrounding COVID have been stressful.  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  She has started gardening as a new hobby but reports feeling "blah" about doing certain things.  She has moderate hot flashes and wakes up in the am with her bed soaked.  The hot flashes interfere with her sleep.  She used to find relief of her insomnia with the use of tylenol pm but was told to avoid tylenol  due to hepatic steatosis. She has moderate bilateral knee discomfort (has had bilateral knee replacements) and has been taking gabapentin for the past three months with little relief. She also recently had an US of the RUQ and CT AP on 07/29/2018 due to right sided abdominal discomfort with the concern for a possible hernia.  She states after evaluation, her providers felt the area was most likely a lipoma or area with scar tissue.   [place] RUQ on 07/23/2018 at [facility]:    IMPRESSION: 1. Enlarged fatty liver 2. Nonobstructive right renal calculi 3. Fat echogenicity mass right abdomen suggesting lipoma or anterior abdominal wall hernia of mesenteric fat or omentum. CT to further evaluate here.  CT AP at [facility] on 07/29/2018: IMPRESSION: 1. No discrete mass seen on CT. Only fat seen within the subcutaneous tissues of the abdominal wall.  The entirety of the abdominal wall is not included in the field-of-view given patient's body habitus and scanner size. 2. Nonobstructing renal stones. 3. Hepatomegaly and hepatic steatosis.  She just purchased a fitbit watch and has been doing weight watchers off and on.   Review of Systems  Constitutional: Feels well. No fever, chills, early satiety, change in appetite. Positive for hot flashes, night sweats.  Cardiovascular: No chest pain, shortness of breath. Mild edema in the knees intermittently.  Pulmonary: No cough or wheeze.  Gastrointestinal: No nausea, vomiting, or diarrhea. No bright red blood per rectum or change in bowel movement.  Genitourinary: No frequency, urgency, or dysuria. No vaginal  bleeding or discharge.  Musculoskeletal: Knee pain from past surgeries. Neurologic: No weakness, numbness, or change in gait.  Psychology: No depression. Insomnia related to hot flashes. Diminished interest in doing things at times.  Health Maintenance: Mammogram: Up to date Pap Smear: Not indicated Colonoscopy: Not indicated  Current Meds:  Outpatient  Encounter Medications as of 09/17/2018  Medication Sig  . acetaminophen (TYLENOL) 325 MG tablet Take 500 mg by mouth as needed. Patient takes medication as needed for pain.  Marland Kitchen estradiol (ESTRACE) 0.5 MG tablet Take 2 tablets (1 mg total) by mouth daily.  Marland Kitchen gabapentin (NEURONTIN) 300 MG capsule Take 600 mg by mouth 3 (three) times daily.  Marland Kitchen losartan-hydrochlorothiazide (HYZAAR) 100-12.5 MG tablet Take 1 tablet by mouth daily.  . metFORMIN (GLUCOPHAGE-XR) 500 MG 24 hr tablet TAKE ONE TABLET (500 MG TOTAL) BY MOUTH WITH BREAKFAST.  . [DISCONTINUED] venlafaxine XR (EFFEXOR-XR) 75 MG 24 hr capsule Take by mouth.  . venlafaxine XR (EFFEXOR-XR) 150 MG 24 hr capsule Take 1 capsule (150 mg total) by mouth daily with breakfast.   No facility-administered encounter medications on file as of 09/17/2018.     Allergy: No Known Allergies  Social Hx:   Social History   Socioeconomic History  . Marital status: Married    Spouse name: Not on file  . Number of children: Not on file  . Years of education: Not on file  . Highest education level: Not on file  Occupational History  . Not on file  Social Needs  . Financial resource strain: Not on file  . Food insecurity    Worry: Not on file    Inability: Not on file  . Transportation needs    Medical: Not on file    Non-medical: Not on file  Tobacco Use  . Smoking status: Former Smoker    Packs/day: 0.25    Years: 2.00    Pack years: 0.50    Quit date: 06/17/1998    Years since quitting: 20.2  . Smokeless tobacco: Never Used  Substance and Sexual Activity  . Alcohol use: No    Alcohol/week: 0.0 standard drinks  . Drug use: No  . Sexual activity: Yes    Birth control/protection: Condom  Lifestyle  . Physical activity    Days per week: Not on file    Minutes per session: Not on file  . Stress: Not on file  Relationships  . Social Herbalist on phone: Not on file    Gets together: Not on file    Attends religious service: Not on  file    Active member of club or organization: Not on file    Attends meetings of clubs or organizations: Not on file    Relationship status: Not on file  . Intimate partner violence    Fear of current or ex partner: Not on file    Emotionally abused: Not on file    Physically abused: Not on file    Forced sexual activity: Not on file  Other Topics Concern  . Not on file  Social History Narrative  . Not on file    Past Surgical Hx:  Past Surgical History:  Procedure Laterality Date  . BREAST REDUCTION SURGERY    . CESAREAN SECTION     x 2  . FOOT SURGERY Right   . JOINT REPLACEMENT Left 12/15   total knee  . KNEE SURGERY Left    scope, acl x 6  . KNEE SURGERY Right  scope with partial acl  . ROBOTIC ASSISTED TOTAL HYSTERECTOMY WITH BILATERAL SALPINGO OOPHERECTOMY N/A 06/23/2014   Procedure: ROBOTIC ASSISTED TOTAL HYSTERECTOMY WITH BILATERAL SALPINGO OOPHORECTOMY/SENTINAL LYMPHNODE LEFT PELVIC LYMPH BIOPSY;  Surgeon: Everitt Amber, MD;  Location: [facility];  Service: Gynecology;  Laterality: N/A;  . TONSILLECTOMY      Past Medical Hx:  Past Medical History:  Diagnosis Date  . Cancer [facility])    endometrial  . Osteoarthritis     Family Hx:  Family History  Problem Relation Age of Onset  . Diabetes Father     Vitals:  Blood pressure 129/82, pulse 84, temperature 98.7 F (37.1 C), temperature source Tympanic, resp. rate 20, height '5\' 8"'$  (1.727 m), weight (!) 346 lb (156.9 kg), SpO2 97 %.  Physical Exam:  General: Well developed, well nourished female in no acute distress. Alert and oriented x 3.  Neck: Supple without any enlargements.  Lymph node survey: No cervical, supraclavicular adenopathy.  Cardiovascular: Regular rate and rhythm. S1 and S2 normal.  Lungs: Clear to auscultation bilaterally. No wheezes/crackles/rhonchi noted.  Skin: No rashes or lesions present. Back: No CVA tenderness.  Abdomen: Abdomen soft, non-tender and obese. Active bowel sounds in all  quadrants. No evidence of a fluid wave or abdominal masses but limited due to habitus.  Genitourinary:    Vulva/vagina: Normal external female genitalia. No lesions.    Urethra: No lesions or masses    Vagina: No lesions. No palpable masses. No vaginal bleeding or drainage noted. Vaginal cuff intact. No cul de sac fullness.  Rectal: pt declined.  Extremities: No bilateral cyanosis, edema, or clubbing.   Assessment/Plan:  43 y.o. 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   S/p robotic hysterectomy, BSO, sentinel lymph node biopsy on 06/23/14. no LVSI, 15% myometrial invasion, negative pelvic washings and negative lymph nodes.  No evidence of recurrence on today's   exam.  Symptomatic menopause- given the concern about increasing hormones and with the reported flattened mood/desire at times, plan to increase the effexor to 150 mg XL once daily. Reportable signs and symptoms reviewed including changes in mood or suicidal ideations.  She is advised to please call with an update on her symptoms.  She is to follow up in one year or sooner if needed.  She is encouraged to continue her weight loss efforts.    Stacey Gibbs, NP 09/17/2018, 12:41 PM

## 2018-10-01 ENCOUNTER — Telehealth: Payer: Self-pay

## 2018-10-01 DIAGNOSIS — C541 Malignant neoplasm of endometrium: Secondary | ICD-10-CM

## 2018-10-01 MED ORDER — ESTRADIOL 0.5 MG PO TABS: 0.5000 mg | ORAL_TABLET | Freq: Every day | ORAL | 11 refills | Status: DC

## 2018-10-01 NOTE — Telephone Encounter (Signed)
Melissa reviewed the note.  Ms Narayan will call in 2-3 weeks with an up date as to how she is doing.   Hopefully the increased stools will decrease with body adjustment. Pt taking 1 tablet of Estradiol 0.5 mg.  Will send in a month's supply with 12 refills per Joylene John, NP.

## 2018-10-01 NOTE — Telephone Encounter (Signed)
Ms Foulk states that she feels that her mood/motivation has improved with the increased dose of Effexor. The hit flashes have decreased. The last few nights she slept better The only issue she is having with the medication is abdominal cramping(PMS) and loose stools. She has days were loose stools ar 1 and some days 3.  She has increased fluid intake. She has also noticed a decrease in appetie, which is does not mind. She noticed that she feels better when she eats a good breakfast since beginning the increased dose. She was going to see how she was doing by the end of July to see if the cramping and loose stools decrease. Told her that the information would be reviewed with Melissa and see if loose stools are a side effect and if so, will it decrease as her body adjusts to the increased dose.  Pt also has 1 week of her Estradiol left. A refill was not sent in on 09-17-18. [facility] uses [facility] [address] in [place]

## 2018-11-08 ENCOUNTER — Other Ambulatory Visit: Payer: Self-pay | Admitting: Gynecologic Oncology

## 2018-11-08 DIAGNOSIS — N951 Menopausal and female climacteric states: Secondary | ICD-10-CM

## 2019-01-13 ENCOUNTER — Telehealth: Payer: Self-pay | Admitting: *Deleted

## 2019-01-13 ENCOUNTER — Other Ambulatory Visit: Payer: Self-pay | Admitting: Gynecologic Oncology

## 2019-01-13 DIAGNOSIS — N951 Menopausal and female climacteric states: Secondary | ICD-10-CM

## 2019-01-13 MED ORDER — VENLAFAXINE HCL ER 75 MG PO CP24: 75.0000 mg | ORAL_CAPSULE | Freq: Every day | ORAL | 6 refills | Status: DC

## 2019-01-13 NOTE — Telephone Encounter (Signed)
Patient called and stated "I tried for several month on the effexor higher dose, my stomach is not taking it. I need to back down. Can Melissa send in a new script for me." Message forwarded to [facility] APP

## 2019-01-13 NOTE — Progress Notes (Signed)
See telephone note. Patient calling to request lower dose of effexor due to stomach issues.

## 2019-01-13 NOTE — Telephone Encounter (Signed)
Stacey Gibbs, NP  Baruch Merl, RN; [place], Deliah Goody, RN  Caller: Unspecified (Today, 10:31 AM)        Have sent in the 75 mg extended release capsule. Can you see how her hot flashes are and quality of life with the menopausal symptoms? There is a 100 mg tablet but it has to be taken twice a day since it is not in the extended release form. If she is good to stick with the once a day 75 mg dose, we can see how that works. Thank you

## 2019-01-13 NOTE — Telephone Encounter (Signed)
Told Stacey Arroyo the dose of medication and alternative dose of 100 mg Bid as noted belwo by Stacey John, NP. Stacey Arroyo stated that she will try the 75 mg through the end of the year. She will call if not effective.

## 2019-04-21 ENCOUNTER — Other Ambulatory Visit: Payer: Self-pay | Admitting: *Deleted

## 2019-04-21 ENCOUNTER — Ambulatory Visit
Admission: RE | Admit: 2019-04-21 | Discharge: 2019-04-21 | Disposition: A | Discharge status: Home / Self Care | Payer: Self-pay | Source: Ambulatory Visit | Attending: Gynecologic Oncology | Admitting: Gynecologic Oncology

## 2019-04-21 ENCOUNTER — Telehealth: Payer: Self-pay | Admitting: *Deleted

## 2019-04-21 DIAGNOSIS — C541 Malignant neoplasm of endometrium: Secondary | ICD-10-CM

## 2019-04-21 NOTE — Telephone Encounter (Signed)
Patient called and left a message stating " I have being having a lot of abdomen pain lately, and had a couple of scans. They have sent me to a GI doctor for the pain. The scans show a lipoma, they believe is from my hysterectomy in 2016. You can call me and I will explain more."     Scan is care everywhere

## 2019-04-23 ENCOUNTER — Telehealth: Payer: Self-pay | Admitting: *Deleted

## 2019-04-23 NOTE — Telephone Encounter (Signed)
Called and scheduled the patient to see Dr Denman George next week

## 2019-04-30 ENCOUNTER — Inpatient Hospital Stay: Payer: BC Managed Care – PPO | Attending: Gynecologic Oncology | Admitting: Gynecologic Oncology

## 2019-04-30 ENCOUNTER — Other Ambulatory Visit: Payer: Self-pay

## 2019-04-30 ENCOUNTER — Inpatient Hospital Stay: Payer: BC Managed Care – PPO

## 2019-04-30 ENCOUNTER — Encounter: Payer: Self-pay | Admitting: Gynecologic Oncology

## 2019-04-30 VITALS — BP 143/82 | HR 95 | Temp 97.8°F | Resp 18 | Ht 68.0 in | Wt 360.4 lb

## 2019-04-30 DIAGNOSIS — R5383 Other fatigue: Secondary | ICD-10-CM | POA: Insufficient documentation

## 2019-04-30 DIAGNOSIS — R109 Unspecified abdominal pain: Secondary | ICD-10-CM | POA: Diagnosis not present

## 2019-04-30 DIAGNOSIS — R197 Diarrhea, unspecified: Secondary | ICD-10-CM | POA: Diagnosis not present

## 2019-04-30 DIAGNOSIS — R1011 Right upper quadrant pain: Secondary | ICD-10-CM

## 2019-04-30 DIAGNOSIS — R222 Localized swelling, mass and lump, trunk: Secondary | ICD-10-CM

## 2019-04-30 DIAGNOSIS — R160 Hepatomegaly, not elsewhere classified: Secondary | ICD-10-CM | POA: Diagnosis not present

## 2019-04-30 DIAGNOSIS — K760 Fatty (change of) liver, not elsewhere classified: Secondary | ICD-10-CM | POA: Diagnosis not present

## 2019-04-30 DIAGNOSIS — Z8542 Personal history of malignant neoplasm of other parts of uterus: Secondary | ICD-10-CM | POA: Insufficient documentation

## 2019-04-30 DIAGNOSIS — Z79899 Other long term (current) drug therapy: Secondary | ICD-10-CM | POA: Diagnosis not present

## 2019-04-30 DIAGNOSIS — Z7984 Long term (current) use of oral hypoglycemic drugs: Secondary | ICD-10-CM | POA: Diagnosis not present

## 2019-04-30 DIAGNOSIS — Z90710 Acquired absence of both cervix and uterus: Secondary | ICD-10-CM | POA: Diagnosis not present

## 2019-04-30 DIAGNOSIS — Z90722 Acquired absence of ovaries, bilateral: Secondary | ICD-10-CM | POA: Insufficient documentation

## 2019-04-30 DIAGNOSIS — Z08 Encounter for follow-up examination after completed treatment for malignant neoplasm: Secondary | ICD-10-CM | POA: Diagnosis not present

## 2019-04-30 DIAGNOSIS — Z87891 Personal history of nicotine dependence: Secondary | ICD-10-CM | POA: Insufficient documentation

## 2019-04-30 DIAGNOSIS — C541 Malignant neoplasm of endometrium: Secondary | ICD-10-CM

## 2019-04-30 LAB — BASIC METABOLIC PANEL
Anion gap: 13 (ref 5–15)
BUN: 14 mg/dL (ref 6–20)
CO2: 28 mmol/L (ref 22–32)
Calcium: 9.2 mg/dL (ref 8.9–10.3)
Chloride: 98 mmol/L (ref 98–111)
Creatinine, Ser: 1.17 mg/dL — ABNORMAL HIGH (ref 0.44–1.00)
GFR calc Af Amer: >60 mL/min (ref >60)
GFR calc non Af Amer: 56 mL/min — ABNORMAL LOW (ref >60)
Glucose, Bld: 232 mg/dL — ABNORMAL HIGH (ref 70–99)
Potassium: 3.9 mmol/L (ref 3.5–5.1)
Sodium: 139 mmol/L (ref 135–145)

## 2019-04-30 NOTE — Progress Notes (Signed)
Follow Up Note: Gyn-Onc  Stacey Arroyo 44 y.o. female  CC:  No chief complaint on file.  Assessment/Plan:  44 y.o. 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   S/p robotic hysterectomy, BSO, sentinel lymph node biopsy on 06/23/14. no LVSI, 15% myometrial invasion, negative pelvic washings and negative lymph nodes.   No evidence of recurrence on today's exam.    Abdominal pain and right mid abdominal wall mass on US imaging: unlikely to be port site met (based on [place] appearance). Favor port site hernia vs lipoma. Will check abdominal and pelvic CT and request consultation with Dr Rosendo Gros from [facility] to weigh in regarding the value of surgical intervention.  She is to follow up in one year or sooner if needed.  She is encouraged to continue her weight loss efforts.    HPI: Stacey Arroyo is a 44 year old female, G2P2, initially seen in consultation at the request of Dr Toney Rakes for endometrial cancer.  She reported abnormal menstruation for at least 2 years. As part of the workup for abnormal uterine bleeding, she underwent an ultrasound scan on 05/29/2014, which revealed a uterus measuring 9.7 x 7.1 x 5.3 cm with a 13 mm endometrial stripe. An endometrial biopsy was performed on the same day which revealed a FIGO grade 1 endometrial adenocarcinoma.  She then underwent a robotic hysterectomy, BSO and sentinel lymph node biopsy on 2/0/25 without complications.  Her postoperative course was uncomplicated.  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 MMR protein testing (for possible lynch syndrome) was negative.  A CT abdo/pelvis was ordered in February, 2017 to evaluate post-op pelvic pressure and was unremarkable for space occupying lesion/collection and no masses were seen.  She also had microscopic hematuria diagnosed in 2018  and was seen by Dr Louis Meckel. A CT scan showed kidney stones.  Interval History: She presents today for evaluation of right abdominal wall mass and pain.  She had an US of the RUQ and CT AP on 07/29/2018 due to right sided abdominal discomfort with the concern for a possible hernia.  The entirety of the abdominal wall is not included in the field-of-view given patient's body habitus and scanner size. She states after evaluation, her providers felt the area was most likely a lipoma or area with scar tissue.   The pain symptoms increased after Thanksgiving of 2020. She notes a "gong going off" like feeling of pain in the right mid abdominal wall. It does not have a strong relationship to eating. It is intermittent. It feels better when she lies on the right side. It is worse when she lies on the left. It is increasing in intensity over time. She thinks the size of the palpable mass under the right abdominal port site is slightly increasing too.  In response to these symptoms she underwent an US of the RUQ/abdominal wall through [facility] (images in our system) on April 04, 2019 which showed an enlarged liver measuring 25 cm with steatosis.  The gallbladder contained no stones sludge or wall thickening.  The wall thickness was 2 mm.  There was sonographic Murphy sign negative.  Common bile duct was normal at 6 mm.  No intrahepatic duct dilation.  There was a 1 cm nonobstructing calculus in the right interpolar hilar region of the kidney.  In the abdominal wall soft tissues there  was a prominent fat lobule versus lipoma with identical sonographic characteristics of the surrounding subcutaneous fat.  No suspicious features to this lobule on ultrasound imaging.  She also reported loose stools, fatigue, weight gain, and just generally feeling off.  Review of Systems  Constitutional: Feels well. No fever, chills, early satiety, change in appetite. Positive for hot flashes, night sweats.  Cardiovascular: No chest  pain, shortness of breath. Mild edema in the knees intermittently.  Pulmonary: No cough or wheeze.  Gastrointestinal: No nausea, vomiting, or diarrhea. No bright red blood per rectum or change in bowel movement.  Genitourinary: No frequency, urgency, or dysuria. No vaginal bleeding or discharge.  Musculoskeletal: Knee pain from past surgeries. Neurologic: No weakness, numbness, or change in gait.  Psychology: No depression. Insomnia related to hot flashes. Diminished interest in doing things at times.  Health Maintenance: Mammogram: Up to date Pap Smear: Not indicated Colonoscopy: Not indicated  Current Meds:  Outpatient Encounter Medications as of 04/30/2019  Medication Sig  . acetaminophen (TYLENOL) 325 MG tablet Take 500 mg by mouth as needed. Patient takes medication as needed for pain.  Marland Kitchen estradiol (ESTRACE) 0.5 MG tablet Take 1 tablet (0.5 mg total) by mouth daily.  Marland Kitchen gabapentin (NEURONTIN) 300 MG capsule Take 600 mg by mouth 3 (three) times daily.  Marland Kitchen losartan-hydrochlorothiazide (HYZAAR) 100-12.5 MG tablet Take 1 tablet by mouth daily.  . metFORMIN (GLUCOPHAGE-XR) 500 MG 24 hr tablet TAKE ONE TABLET (500 MG TOTAL) BY MOUTH WITH BREAKFAST.  Marland Kitchen venlafaxine XR (EFFEXOR-XR) 75 MG 24 hr capsule Take 1 capsule (75 mg total) by mouth daily with breakfast.   No facility-administered encounter medications on file as of 04/30/2019.    Allergy: No Known Allergies  Social Hx:   Social History   Socioeconomic History  . Marital status: Married    Spouse name: Not on file  . Number of children: Not on file  . Years of education: Not on file  . Highest education level: Not on file  Occupational History  . Not on file  Tobacco Use  . Smoking status: Former Smoker    Packs/day: 0.25    Years: 2.00    Pack years: 0.50    Quit date: 06/17/1998    Years since quitting: 20.8  . Smokeless tobacco: Never Used  Substance and Sexual Activity  . Alcohol use: No    Alcohol/week: 0.0 standard  drinks  . Drug use: No  . Sexual activity: Yes    Birth control/protection: Condom  Other Topics Concern  . Not on file  Social History Narrative  . Not on file   Social Determinants of Health   Financial Resource Strain:   . Difficulty of Paying Living Expenses: Not on file  Food Insecurity:   . Worried About Charity fundraiser in the Last Year: Not on file  . Ran Out of Food in the Last Year: Not on file  Transportation Needs:   . Lack of Transportation (Medical): Not on file  . Lack of Transportation (Non-Medical): Not on file  Physical Activity:   . Days of Exercise per Week: Not on file  . Minutes of Exercise per Session: Not on file  Stress:   . Feeling of Stress : Not on file  Social Connections:   . Frequency of Communication with Friends and Family: Not on file  . Frequency of Social Gatherings with Friends and Family: Not on file  . Attends Religious Services: Not on file  . Active Member  of Clubs or Organizations: Not on file  . Attends Archivist Meetings: Not on file  . Marital Status: Not on file  Intimate Partner Violence:   . Fear of Current or Ex-Partner: Not on file  . Emotionally Abused: Not on file  . Physically Abused: Not on file  . Sexually Abused: Not on file    Past Surgical Hx:  Past Surgical History:  Procedure Laterality Date  . BREAST REDUCTION SURGERY    . CESAREAN SECTION     x 2  . FOOT SURGERY Right   . JOINT REPLACEMENT Left 12/15   total knee  . KNEE SURGERY Left    scope, acl x 6  . KNEE SURGERY Right    scope with partial acl  . ROBOTIC ASSISTED TOTAL HYSTERECTOMY WITH BILATERAL SALPINGO OOPHERECTOMY N/A 06/23/2014   Procedure: ROBOTIC ASSISTED TOTAL HYSTERECTOMY WITH BILATERAL SALPINGO OOPHORECTOMY/SENTINAL LYMPHNODE LEFT PELVIC LYMPH BIOPSY;  Surgeon: Everitt Amber, MD;  Location: [facility];  Service: Gynecology;  Laterality: N/A;  . TONSILLECTOMY      Past Medical Hx:  Past Medical History:  Diagnosis Date  . Cancer  [facility])    endometrial  . Osteoarthritis     Family Hx:  Family History  Problem Relation Age of Onset  . Diabetes Father     Vitals:  There were no vitals taken for this visit.  Physical Exam:  General: Well developed, well nourished female in no acute distress. Alert and oriented x 3.  Neck: Supple without any enlargements.  Lymph node survey: No cervical, supraclavicular adenopathy.  Cardiovascular: Regular rate and rhythm. S1 and S2 normal.  Lungs: Clear to auscultation bilaterally. No wheezes/crackles/rhonchi noted.  Skin: No rashes or lesions present. Back: No CVA tenderness.  Abdomen: Abdomen soft, non-tender and obese. Active bowel sounds in all quadrants. No evidence of a fluid wave or abdominal masses but limited due to habitus.  Under the right mid abdominal/RUQ port site (above the level of the umbilicus) there is a palpable, somewhat mobile, firm nodule of tissue. It is not characteristic of a portsite metastasis. It is not reducible.  Genitourinary:    Vulva/vagina: Normal external female genitalia. No lesions.    Urethra: No lesions or masses    Vagina: No lesions. No palpable masses. No vaginal bleeding or drainage noted. Vaginal cuff intact. No cul de sac fullness.  Rectal: pt declined.  Extremities: No bilateral cyanosis, edema, or clubbing.   Thereasa Solo, MD 04/30/2019, 2:17 PM

## 2019-04-30 NOTE — Patient Instructions (Addendum)
Dr Denman George is ordering a CT scan to evaluate the abdominal wall mass for possible hernia. She has referred you to Dr Ralene Ok to evaluate for possible port-site hernia.  It is reasonable to discuss with your primary doctor if your symptoms are a result of a low thyroid and do you need medication for this.  Dr Denman George recommends follow-up with Joylene John, NP in 6-12 months for endometrial cancer surveillance.  Call the office at [phone] in August to set up an appointment with Joylene John, NP.

## 2019-05-01 ENCOUNTER — Telehealth: Payer: Self-pay | Admitting: *Deleted

## 2019-05-01 NOTE — Telephone Encounter (Signed)
Fax referral sheet, office note, etc to [facility]

## 2019-05-07 ENCOUNTER — Ambulatory Visit (HOSPITAL_COMMUNITY)
Admission: RE | Admit: 2019-05-07 | Discharge: 2019-05-07 | Disposition: A | Discharge status: Home / Self Care | Payer: BC Managed Care – PPO | Source: Ambulatory Visit | Attending: Gynecologic Oncology | Admitting: Gynecologic Oncology

## 2019-05-07 ENCOUNTER — Other Ambulatory Visit: Payer: Self-pay

## 2019-05-07 ENCOUNTER — Encounter (HOSPITAL_COMMUNITY): Payer: Self-pay

## 2019-05-07 DIAGNOSIS — R222 Localized swelling, mass and lump, trunk: Secondary | ICD-10-CM | POA: Diagnosis present

## 2019-05-07 DIAGNOSIS — R1011 Right upper quadrant pain: Secondary | ICD-10-CM

## 2019-05-07 DIAGNOSIS — C541 Malignant neoplasm of endometrium: Secondary | ICD-10-CM | POA: Diagnosis not present

## 2019-05-07 MED ORDER — SODIUM CHLORIDE (PF) 0.9 % IJ SOLN
INTRAMUSCULAR | Status: AC
  Filled 2019-05-07: qty 50

## 2019-05-07 MED ORDER — IOHEXOL 300 MG/ML  SOLN
100.0000 mL | Freq: Once | INTRAMUSCULAR | Status: AC | PRN
  Administered 2019-05-07: 11:00:00 100 mL via INTRAVENOUS

## 2019-05-08 ENCOUNTER — Telehealth: Payer: Self-pay | Admitting: *Deleted

## 2019-05-08 ENCOUNTER — Telehealth: Payer: Self-pay

## 2019-05-08 NOTE — Telephone Encounter (Signed)
Patient is scheduled to see Dr Rosendo Gros on 05/21/2019 at 11am

## 2019-05-08 NOTE — Telephone Encounter (Signed)
Told her that the CT showed no acute findings. Kidney stones in each kidney and an enlarged  fatty liver. Keep appointment with Dr. Rosendo Gros with [facility] on 05-21-19 as scheduled per Joylene John, NP.

## 2019-05-14 ENCOUNTER — Telehealth: Payer: Self-pay | Admitting: *Deleted

## 2019-05-14 NOTE — Telephone Encounter (Signed)
Fax CT results to [facility] for 3/3 appt

## 2019-10-30 ENCOUNTER — Other Ambulatory Visit: Payer: Self-pay | Admitting: Gynecologic Oncology

## 2019-10-30 DIAGNOSIS — C541 Malignant neoplasm of endometrium: Secondary | ICD-10-CM

## 2019-12-03 ENCOUNTER — Other Ambulatory Visit: Payer: Self-pay | Admitting: Gynecologic Oncology

## 2019-12-03 ENCOUNTER — Telehealth: Payer: Self-pay

## 2019-12-03 DIAGNOSIS — N951 Menopausal and female climacteric states: Secondary | ICD-10-CM

## 2019-12-03 NOTE — Telephone Encounter (Signed)
LM for Ms Stacey Arroyo to call the office to verify the dose of venlafaxine she is on as dosing in epic is 75 mg not 150 mg.  Lenna Sciara will send in the prescription for the dose verified by Ms Hassell Done.

## 2019-12-04 NOTE — Telephone Encounter (Signed)
LVM for patient to call back.  Need clarification of venlafaxine dosage.

## 2019-12-05 NOTE — Telephone Encounter (Signed)
Stacey Arroyo states that she takes Venlafaxine 75 mg daily not 150 mg. She did not request the refill on the 150 mg and she has told [facility] as well. Will give information to Joylene John, NP.

## 2020-02-02 ENCOUNTER — Other Ambulatory Visit: Payer: Self-pay

## 2020-02-02 DIAGNOSIS — N951 Menopausal and female climacteric states: Secondary | ICD-10-CM

## 2020-02-02 MED ORDER — VENLAFAXINE HCL ER 75 MG PO CP24: 75.0000 mg | ORAL_CAPSULE | Freq: Every day | ORAL | 6 refills | Status: DC

## 2020-02-02 NOTE — Telephone Encounter (Signed)
Joylene John, NP approved  Refill of medication 02-02-20

## 2020-12-09 ENCOUNTER — Other Ambulatory Visit: Payer: Self-pay

## 2020-12-09 DIAGNOSIS — C541 Malignant neoplasm of endometrium: Secondary | ICD-10-CM

## 2020-12-09 MED ORDER — ESTRADIOL 0.5 MG PO TABS
0.5000 mg | ORAL_TABLET | Freq: Every day | ORAL | 1 refills | Status: DC
Start: 2020-12-09 — End: 2021-01-06

## 2020-12-09 NOTE — Telephone Encounter (Signed)
LM for Stacey Arroyo to call back to the office to schedule a f/u appointment with Melissa.  Pt was to f/u per Dr. Serita Grit office note 04-30-19 in  6 months to a year with Melissa Cross,NP. Melissa will send in a refill with 1 additional refill at this time. No further refills will be given until she is seen in the office for f/u.

## 2021-01-06 ENCOUNTER — Other Ambulatory Visit: Payer: Self-pay | Admitting: Gynecologic Oncology

## 2021-01-06 ENCOUNTER — Telehealth: Payer: Self-pay | Admitting: *Deleted

## 2021-01-06 DIAGNOSIS — C541 Malignant neoplasm of endometrium: Secondary | ICD-10-CM

## 2021-01-06 NOTE — Telephone Encounter (Signed)
Called and left the patient and a message to call the office back. Patient needs a follow up appt with Dr Delsa Sale since the office refilled her medicine

## 2021-01-06 NOTE — Telephone Encounter (Signed)
Patient is overdue for office visit. No more refills to be given until seen.

## 2021-01-07 ENCOUNTER — Encounter: Payer: Self-pay | Admitting: *Deleted

## 2021-01-07 ENCOUNTER — Telehealth: Payer: Self-pay | Admitting: *Deleted

## 2021-01-07 NOTE — Telephone Encounter (Signed)
Letter mailed to patient to call the office to schedule an appt for a med refill

## 2021-01-31 ENCOUNTER — Other Ambulatory Visit: Payer: Self-pay | Admitting: Gynecologic Oncology

## 2021-01-31 DIAGNOSIS — C541 Malignant neoplasm of endometrium: Secondary | ICD-10-CM

## 2021-02-04 ENCOUNTER — Other Ambulatory Visit: Payer: Self-pay | Admitting: Gynecologic Oncology

## 2021-02-04 DIAGNOSIS — C541 Malignant neoplasm of endometrium: Secondary | ICD-10-CM

## 2021-02-20 ENCOUNTER — Other Ambulatory Visit: Payer: Self-pay | Admitting: Gynecologic Oncology

## 2021-02-20 DIAGNOSIS — N951 Menopausal and female climacteric states: Secondary | ICD-10-CM

## 2021-04-04 ENCOUNTER — Telehealth: Payer: Self-pay

## 2021-04-04 ENCOUNTER — Other Ambulatory Visit: Payer: Self-pay | Admitting: Gynecologic Oncology

## 2021-04-04 DIAGNOSIS — N951 Menopausal and female climacteric states: Secondary | ICD-10-CM

## 2021-04-04 NOTE — Telephone Encounter (Signed)
Received call from Ms. Stacey Arroyo today requesting a follow up appointment with Stacey John, NP. Patient reports she has not taken estrace for about a month and ran out of Effexor two days ago. Stacey John, NP notified. Follow up appointment scheduled for 04/15/21 at 10:30 am. A refill for Effexor has been sent to patients pharmacy. Patient verbalized understanding and is in agreement of appointment date and time.

## 2021-04-05 ENCOUNTER — Other Ambulatory Visit: Payer: Self-pay | Admitting: Gynecologic Oncology

## 2021-04-05 ENCOUNTER — Telehealth: Payer: Self-pay

## 2021-04-05 DIAGNOSIS — C541 Malignant neoplasm of endometrium: Secondary | ICD-10-CM

## 2021-04-05 DIAGNOSIS — N951 Menopausal and female climacteric states: Secondary | ICD-10-CM

## 2021-04-05 MED ORDER — ESTRADIOL 0.5 MG PO TABS: 0.5000 mg | ORAL_TABLET | Freq: Every day | ORAL | 0 refills | Status: DC

## 2021-04-05 MED ORDER — VENLAFAXINE HCL ER 75 MG PO CP24: 75.0000 mg | ORAL_CAPSULE | Freq: Every day | ORAL | 0 refills | Status: DC

## 2021-04-05 NOTE — Telephone Encounter (Signed)
Patient called and requested a refill for Effexor-XR 75 mg. Notified Melissa, NP and she sent it in along with Estradiol 0.5 mg.

## 2021-04-13 ENCOUNTER — Telehealth: Payer: Self-pay

## 2021-04-13 NOTE — Telephone Encounter (Signed)
Called patient to move appointment due to a meeting.  Left patient a message to return call.

## 2021-04-14 ENCOUNTER — Encounter: Payer: Self-pay | Admitting: Gynecologic Oncology

## 2021-04-15 ENCOUNTER — Inpatient Hospital Stay: Payer: BC Managed Care – PPO | Admitting: Gynecologic Oncology

## 2021-04-15 ENCOUNTER — Other Ambulatory Visit: Payer: Self-pay

## 2021-04-15 ENCOUNTER — Inpatient Hospital Stay: Payer: BC Managed Care – PPO

## 2021-04-15 ENCOUNTER — Inpatient Hospital Stay: Payer: BC Managed Care – PPO | Attending: Gynecologic Oncology | Admitting: Gynecologic Oncology

## 2021-04-15 ENCOUNTER — Telehealth: Payer: Self-pay

## 2021-04-15 VITALS — BP 122/69 | HR 78 | Temp 98.6°F | Resp 16 | Ht 67.0 in | Wt 339.0 lb

## 2021-04-15 DIAGNOSIS — Z7984 Long term (current) use of oral hypoglycemic drugs: Secondary | ICD-10-CM | POA: Insufficient documentation

## 2021-04-15 DIAGNOSIS — N951 Menopausal and female climacteric states: Secondary | ICD-10-CM

## 2021-04-15 DIAGNOSIS — Z90710 Acquired absence of both cervix and uterus: Secondary | ICD-10-CM | POA: Insufficient documentation

## 2021-04-15 DIAGNOSIS — R3915 Urgency of urination: Secondary | ICD-10-CM | POA: Diagnosis not present

## 2021-04-15 DIAGNOSIS — E119 Type 2 diabetes mellitus without complications: Secondary | ICD-10-CM | POA: Insufficient documentation

## 2021-04-15 DIAGNOSIS — Z8542 Personal history of malignant neoplasm of other parts of uterus: Secondary | ICD-10-CM | POA: Diagnosis present

## 2021-04-15 DIAGNOSIS — R3989 Other symptoms and signs involving the genitourinary system: Secondary | ICD-10-CM

## 2021-04-15 DIAGNOSIS — R6882 Decreased libido: Secondary | ICD-10-CM | POA: Diagnosis not present

## 2021-04-15 DIAGNOSIS — C541 Malignant neoplasm of endometrium: Secondary | ICD-10-CM

## 2021-04-15 DIAGNOSIS — Z87891 Personal history of nicotine dependence: Secondary | ICD-10-CM | POA: Diagnosis not present

## 2021-04-15 DIAGNOSIS — N390 Urinary tract infection, site not specified: Secondary | ICD-10-CM

## 2021-04-15 DIAGNOSIS — M1990 Unspecified osteoarthritis, unspecified site: Secondary | ICD-10-CM | POA: Diagnosis not present

## 2021-04-15 DIAGNOSIS — E8941 Symptomatic postprocedural ovarian failure: Secondary | ICD-10-CM | POA: Insufficient documentation

## 2021-04-15 DIAGNOSIS — Z79899 Other long term (current) drug therapy: Secondary | ICD-10-CM | POA: Insufficient documentation

## 2021-04-15 DIAGNOSIS — Z87440 Personal history of urinary (tract) infections: Secondary | ICD-10-CM | POA: Diagnosis not present

## 2021-04-15 DIAGNOSIS — Z90722 Acquired absence of ovaries, bilateral: Secondary | ICD-10-CM | POA: Diagnosis not present

## 2021-04-15 LAB — URINALYSIS, COMPLETE (UACMP) WITH MICROSCOPIC
Bilirubin Urine: NEGATIVE
Glucose, UA: NEGATIVE mg/dL
Hgb urine dipstick: NEGATIVE
Ketones, ur: 5 mg/dL — AB
Leukocytes,Ua: NEGATIVE
Nitrite: NEGATIVE
Protein, ur: NEGATIVE mg/dL
Specific Gravity, Urine: 1.019 (ref 1.005–1.030)
pH: 6 (ref 5.0–8.0)

## 2021-04-15 MED ORDER — VENLAFAXINE HCL ER 75 MG PO CP24: 75.0000 mg | ORAL_CAPSULE | Freq: Every day | ORAL | 12 refills | Status: DC

## 2021-04-15 MED ORDER — ESTRADIOL 0.5 MG PO TABS: 0.5000 mg | ORAL_TABLET | Freq: Every day | ORAL | 12 refills | Status: DC

## 2021-04-15 NOTE — Patient Instructions (Addendum)
Nothing concerning seen on today's exam. We will send your urine to be tested and will contact you with the results. Given your symptoms, I would recommend further evaluation with your urologist. Congratulations on your weight loss! We will send in refills of your medication to your pharmacy.   Plan to follow up in one year or sooner if needed. Please call our office at [phone] in Nov or Dec 2023 to schedule an appointment for January 2024.  Symptoms to report to your health care team include vaginal bleeding, rectal bleeding, bloating, weight loss without effort, new and persistent pain, new and  persistent fatigue, new leg swelling, new masses (i.e., bumps in your neck or groin), new and persistent cough, new and persistent nausea and vomiting, change in bowel or bladder habits, and any other concerns.

## 2021-04-15 NOTE — Progress Notes (Signed)
Follow Up Note: Gyn-Onc  Stacey Arroyo 46 y.o. female  CC:  Chief Complaint  Patient presents with   Endometrial cancer [facility])    HPI: Stacey Arroyo is a 46 year old female, G2P2, initially seen in consultation at the request of Dr Toney Rakes for endometrial cancer.  She reported abnormal menstruation for at least 2 years. As part of the workup for abnormal uterine bleeding, she underwent an ultrasound scan on 05/29/2014, which revealed a uterus measuring 9.7 x 7.1 x 5.3 cm with a 13 mm endometrial stripe. An endometrial biopsy was performed on the same day which revealed a FIGO grade 1 endometrial adenocarcinoma.   She then underwent a robotic hysterectomy, BSO and sentinel lymph node biopsy on 03/22/22 without complications. Her postoperative course was uncomplicated. 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 MMR protein testing (for possible lynch syndrome) was negative.   A CT abdo/pelvis was ordered in February, 2017 to evaluate post-op pelvic pressure and was unremarkable for space occupying lesion/collection and no masses were seen.  She also had microscopic hematuria diagnosed in 2018 and was seen by Dr Louis Meckel. A CT scan showed kidney stones.  [place] RUQ on 07/23/2018 at [facility]:    IMPRESSION: 1. Enlarged fatty liver 2. Nonobstructive right renal calculi 3. Fat echogenicity mass right abdomen suggesting lipoma or anterior abdominal wall hernia of mesenteric fat or omentum. CT to further evaluate here.  CT AP at [facility] on 07/29/2018: IMPRESSION: 1.  No discrete mass seen on CT. Only fat seen within the subcutaneous tissues of the abdominal wall.  The entirety of the abdominal wall is not included in the field-of-view given patient's body habitus and scanner size. 2.  Nonobstructing renal stones. 3.  Hepatomegaly and hepatic steatosis.  She was last  seen in our office in February 2021 by Dr. Everitt Amber for follow-up.  At this visit, there was no evidence of recurrence on examination and a CT scan of the abdomen and pelvis was ordered to further evaluate abdominal pain and a right mid abdominal wall mass seen on ultrasound imaging.  The CT scan of the abdomen and pelvis was performed on May 07, 2019 and resulted no acute findings, bilateral renal stones, and an enlarged steatotic liver.  Interval History: She presents today for endometrial cancer follow up.  She states she has been doing well overall since her last visit.  She has had ups and downs since her last visit with her sister-in-law passing away at the age of 72 in March of last year of a heart attack. This has been very tough for her family.    She reports no symptoms today that are concerning for endometrial cancer recurrence.  She denies abdominal pain, changes in appetite, unintentional weight loss or gain, early satiety, abdominal bloating, changes in bowel habits, vaginal bleeding or discharge. There have been on changes with the abdominal wall soft tissue area.    She has been having issues with recurrent urinary tract infections since her last visit.  She has seen Dr. Sherryll Burger at [facility] for her kidney stones.  She states she has intermittent feelings of having a full bladder and urgency, with the symptoms more noticeable at nighttime.  She has been seen by her primary care who has tested a urinary sample and she was treated with Macrobid.  She states at the completion of this medication,  the fullness sensation was still present.  She was seen last Tuesday by her primary care and had a repeat urine sample which was negative and her symptoms ended 4 to 5 days ago.  Her daughter has dealt with urinary reflux since a baby and has had procedures for this.  She states she has been told that this can be a genetic finding and she is wondering if this may be contributing to her symptoms.   She denies hematuria or dysuria.  She feels like she empties her bladder completely.  She is planning on following up with her urologist for further evaluation.  She also reports decreased libido.  She has been on Ozempic for the past 6 months for weight loss and has tolerated this medication well but feels she may have plateaued.  Her knees feel better after replacement and she tries to walk for exercise.    She has recently seen a lung specialist for follow-up on a pulmonary nodule noted on a scan she had for kidney stones.  She states the pulmonologist feels the nodule is benign but given her history, he plans for repeat imaging in 1 year.  Her weight is down to 339 pounds from 360 pounds at her last visit in 2021.  Her last A1c in our system was 8 months ago at 6.5.  She has regular follow-up with her primary care.  She states her bowels have been doing well since her last visit.  She had seen a gastroenterologist in the past who said she may have IBS but she states her symptoms seem stable.  She feels that taking the Effexor and estradiol have decreased the hot flashes and she is able to rest at night.  She feels the Effexor has been helpful over the past several months during the grieving period for her sister-in-law.  She has also been started on Vyvanse and feels that this is helped her to become more task oriented.  She continues to enjoy making pottery.  Review of Systems  Constitutional: Feels well. No fever, chills, early satiety, change in appetite. Positive for mild hot flashes that have improved with effexor and estradiol.  Cardiovascular: No chest pain, shortness of breath. No edema reported. Pulmonary: No cough or wheeze.  Gastrointestinal: No nausea, vomiting, or diarrhea. No bright red blood per rectum or change in bowel movement.  Genitourinary: Positive for fullness, urgency, and mild pressure. No frequency or dysuria. No vaginal bleeding or discharge.  Musculoskeletal: Knee pain  improved. Neurologic: No new weakness, numbness, or change in gait.  Psychology: No depression. Insomnia related to hot flashes improved. Effexor has helped with recent challenges times.  Health Maintenance: Mammogram: Up to date Pap Smear: Not indicated Colonoscopy: Not indicated  Current Meds:  Outpatient Encounter Medications as of 04/15/2021  Medication Sig   acetaminophen (TYLENOL) 325 MG tablet Take 500 mg by mouth as needed. Patient takes medication as needed for pain.   cetirizine (ZYRTEC) 5 MG tablet Take by mouth.   Cholecalciferol (VITAMIN D3) 1.25 MG (50000 UT) CAPS Take 25 mcg by mouth.   clindamycin (CLEOCIN) 150 MG capsule TAKE 4 CAPSULES BY MOUTH 1 HOUR BEFORE APPOINTMENT   diclofenac (VOLTAREN) 75 MG EC tablet SMARTSIG:1 Tablet(s) By Mouth Morning-Evening   losartan-hydrochlorothiazide (HYZAAR) 100-12.5 MG tablet Take 1 tablet by mouth daily.   metFORMIN (GLUCOPHAGE-XR) 500 MG 24 hr tablet TAKE ONE TABLET (500 MG TOTAL) BY MOUTH WITH BREAKFAST.   omeprazole (PRILOSEC) 20 MG capsule Take 1 capsule by mouth  daily.   OZEMPIC, 0.25 OR 0.5 MG/DOSE, 2 MG/1.5ML SOPN SMARTSIG:0.38 Milliliter(s) SUB-Q Once a Week   VYVANSE 40 MG capsule Take 40 mg by mouth every morning.   [DISCONTINUED] estradiol (ESTRACE) 0.5 MG tablet Take 1 tablet (0.5 mg total) by mouth daily.   [DISCONTINUED] venlafaxine XR (EFFEXOR-XR) 75 MG 24 hr capsule Take 1 capsule (75 mg total) by mouth daily with breakfast.   dicyclomine (BENTYL) 20 MG tablet Take 20 mg by mouth 4 (four) times daily. (Patient not taking: Reported on 04/14/2021)   estradiol (ESTRACE) 0.5 MG tablet Take 1 tablet (0.5 mg total) by mouth daily.   venlafaxine XR (EFFEXOR-XR) 75 MG 24 hr capsule Take 1 capsule (75 mg total) by mouth daily with breakfast.   No facility-administered encounter medications on file as of 04/15/2021.    Allergy:  Allergies  Allergen Reactions   Amoxicillin Hives    Social Hx:   Social History    Socioeconomic History   Marital status: Married    Spouse name: Not on file   Number of children: Not on file   Years of education: Not on file   Highest education level: Not on file  Occupational History   Not on file  Tobacco Use   Smoking status: Former    Packs/day: 0.25    Years: 2.00    Pack years: 0.50    Types: Cigarettes    Quit date: 06/17/1998    Years since quitting: 22.8   Smokeless tobacco: Never  Vaping Use   Vaping Use: Never used  Substance and Sexual Activity   Alcohol use: No    Alcohol/week: 0.0 standard drinks   Drug use: No   Sexual activity: Yes    Birth control/protection: Condom  Other Topics Concern   Not on file  Social History Narrative   Not on file   Social Determinants of Health   Financial Resource Strain: Not on file  Food Insecurity: Not on file  Transportation Needs: Not on file  Physical Activity: Not on file  Stress: Not on file  Social Connections: Not on file  Intimate Partner Violence: Not on file    Past Surgical Hx:  Past Surgical History:  Procedure Laterality Date   BREAST REDUCTION SURGERY     CESAREAN SECTION     x 2   FOOT SURGERY Right    JOINT REPLACEMENT Left 12/15   total knee   KNEE SURGERY Left    scope, acl x 6   KNEE SURGERY Right    scope with partial acl   ROBOTIC ASSISTED TOTAL HYSTERECTOMY WITH BILATERAL SALPINGO OOPHERECTOMY N/A 06/23/2014   Procedure: ROBOTIC ASSISTED TOTAL HYSTERECTOMY WITH BILATERAL SALPINGO OOPHORECTOMY/SENTINAL LYMPHNODE LEFT PELVIC LYMPH BIOPSY;  Surgeon: Everitt Amber, MD;  Location: [facility];  Service: Gynecology;  Laterality: N/A;   TONSILLECTOMY      Past Medical Hx:  Past Medical History:  Diagnosis Date   Cancer ([place])    endometrial   Osteoarthritis     Family Hx:  Family History  Problem Relation Age of Onset   Diabetes Father     Vitals:  Blood pressure 122/69, pulse 78, temperature 98.6 F (37 C), temperature source Oral, resp. rate 16, height _0  (1.702  m), weight (!) 339 lb (153.8 kg), SpO2 99 %.  Physical Exam:  General: Well developed, well nourished female in no acute distress. Alert and oriented x 3.  Neck: Supple without any enlargements.  Lymph node survey: No cervical, supraclavicular, inguinal  adenopathy.  Cardiovascular: Regular rate and rhythm. S1 and S2 normal.  Lungs: Clear to auscultation bilaterally. No wheezes/crackles/rhonchi noted.  Skin: No rashes or lesions present. Back: No CVA tenderness.  Abdomen: Abdomen soft, non-tender and obese. Active bowel sounds in all quadrants. No evidence of a fluid wave or abdominal masses but limited due to habitus. With RUQ port site, a small, palpable nodule of tissue remains.  Genitourinary:    Vulva/vagina: Normal external female genitalia. No lesions.    Urethra: No lesions or masses    Vagina: No lesions. No palpable masses. No vaginal bleeding or drainage noted. Vaginal cuff intact. No cul de sac fullness.  Rectal: Good tone, no masses, no cul de sac nodularity.  Extremities: No bilateral cyanosis, edema, or clubbing.   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 No LVSI, 15% myometrial invasion, negative pelvic washings and negative lymph nodes. No evidence of recurrence on today's exam. She is advised to follow up with her urologist about her bladder symptoms. Urine sample obtained today and we will send for analysis and culture. She is doing well on the current dosing of effexor and estradiol so plan to continue and refills given. Reportable signs and symptoms reviewed. She is to follow up in one year or sooner if needed.  She is congratulated on and encouraged to continue her weight loss efforts. She is advised to call for any needs or concerns.  Dorothyann Gibbs, NP 04/15/2021, 2:42 PM

## 2021-04-15 NOTE — Telephone Encounter (Signed)
Spoke with patient regarding urinalysis results & pending culture. Joylene John, NP recommended that we wait on the culture to come back and follow up with instructions then.  Informed patient it takes a few days to result and will call back Monday or Tuesday.   Also spoke with patient regarding mammogram.  She had recent mammogram at [place] in October/November 2022.

## 2021-04-16 LAB — URINE CULTURE

## 2021-04-18 ENCOUNTER — Telehealth: Payer: Self-pay

## 2021-04-18 NOTE — Telephone Encounter (Signed)
Spoke with Ms. Sheetz this morning regarding her urine culture results. Pt informed that per Joylene John, NP the urine showed multiple species suggesting possible contamination on collection. Pt was recommended to see a urologist. Pt verbalized understanding and states she is already established with a urologist and will call to make an appt.

## 2021-05-27 ENCOUNTER — Other Ambulatory Visit: Payer: Self-pay | Admitting: Gynecologic Oncology

## 2021-05-27 DIAGNOSIS — N951 Menopausal and female climacteric states: Secondary | ICD-10-CM

## 2021-05-27 DIAGNOSIS — C541 Malignant neoplasm of endometrium: Secondary | ICD-10-CM

## 2022-06-14 ENCOUNTER — Other Ambulatory Visit: Payer: Self-pay | Admitting: Gynecologic Oncology

## 2022-06-14 DIAGNOSIS — N951 Menopausal and female climacteric states: Secondary | ICD-10-CM

## 2022-06-14 DIAGNOSIS — C541 Malignant neoplasm of endometrium: Secondary | ICD-10-CM

## 2022-06-14 MED ORDER — VENLAFAXINE HCL ER 75 MG PO CP24: 75.0000 mg | ORAL_CAPSULE | Freq: Every day | ORAL | 3 refills | Status: DC

## 2022-06-14 MED ORDER — ESTRADIOL 0.5 MG PO TABS: 0.5000 mg | ORAL_TABLET | Freq: Every day | ORAL | 3 refills | Status: DC

## 2022-06-14 NOTE — Progress Notes (Signed)
Refills sent to pharmacy of pts choice.

## 2022-06-19 ENCOUNTER — Encounter: Payer: Self-pay | Admitting: Gynecologic Oncology

## 2022-06-20 ENCOUNTER — Other Ambulatory Visit: Payer: Self-pay

## 2022-06-20 ENCOUNTER — Inpatient Hospital Stay: Payer: BC Managed Care – PPO | Attending: Gynecologic Oncology | Admitting: Gynecologic Oncology

## 2022-06-20 VITALS — BP 128/74 | HR 83 | Temp 98.5°F | Resp 20 | Ht 67.5 in | Wt 329.0 lb

## 2022-06-20 DIAGNOSIS — Z90722 Acquired absence of ovaries, bilateral: Secondary | ICD-10-CM | POA: Insufficient documentation

## 2022-06-20 DIAGNOSIS — Z79899 Other long term (current) drug therapy: Secondary | ICD-10-CM | POA: Diagnosis not present

## 2022-06-20 DIAGNOSIS — N951 Menopausal and female climacteric states: Secondary | ICD-10-CM | POA: Insufficient documentation

## 2022-06-20 DIAGNOSIS — Z79890 Hormone replacement therapy (postmenopausal): Secondary | ICD-10-CM | POA: Diagnosis not present

## 2022-06-20 DIAGNOSIS — C541 Malignant neoplasm of endometrium: Secondary | ICD-10-CM

## 2022-06-20 DIAGNOSIS — Z8542 Personal history of malignant neoplasm of other parts of uterus: Secondary | ICD-10-CM | POA: Insufficient documentation

## 2022-06-20 DIAGNOSIS — Z90710 Acquired absence of both cervix and uterus: Secondary | ICD-10-CM | POA: Diagnosis not present

## 2022-06-20 DIAGNOSIS — E119 Type 2 diabetes mellitus without complications: Secondary | ICD-10-CM | POA: Diagnosis not present

## 2022-06-20 NOTE — Progress Notes (Signed)
Gynecologic Oncology Follow Up  Stacey Arroyo 47 y.o. female  CC:  Chief Complaint  Patient presents with   Endometrial cancer    HPI: Stacey Arroyo is a 47 year old female, G2P2, initially seen in consultation at the request of Dr Toney Rakes for endometrial cancer.  She reported abnormal menstruation for at least 2 years. As part of the workup for abnormal uterine bleeding, she underwent an ultrasound scan on 05/29/2014, which revealed a uterus measuring 9.7 x 7.1 x 5.3 cm with a 13 mm endometrial stripe. An endometrial biopsy was performed on the same day which revealed a FIGO grade 1 endometrial adenocarcinoma.   She then underwent a robotic hysterectomy, BSO and sentinel lymph node biopsy on [id number] without complications. Her postoperative course was uncomplicated. 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 MMR protein testing (for possible lynch syndrome) was negative.   A CT abdomen/pelvis was ordered in February, 2017 to evaluate post-op pelvic pressure and was unremarkable for space occupying lesion/collection and no masses were seen with renal calculi noted.  She also had microscopic hematuria diagnosed in 2018 and was seen by Dr Louis Meckel.   [place] RUQ on 07/23/2018 at [facility]:    IMPRESSION: 1. Enlarged fatty liver 2. Nonobstructive right renal calculi 3. Fat echogenicity mass right abdomen suggesting lipoma or anterior abdominal wall hernia of mesenteric fat or omentum. CT to further evaluate here.  CT AP at [facility] on 07/29/2018: IMPRESSION: 1.  No discrete mass seen on CT. Only fat seen within the subcutaneous tissues of the abdominal wall.  The entirety of the abdominal wall is not included in the field-of-view given patient's body habitus and scanner size. 2.  Nonobstructing renal stones. 3.  Hepatomegaly and hepatic steatosis.  At her visit  with Dr. Denman George in February 2021, there was no evidence of recurrence on examination and a CT scan of the abdomen and pelvis was ordered to further evaluate abdominal pain and a right mid abdominal wall mass seen on ultrasound imaging.  The CT scan of the abdomen and pelvis was performed on May 07, 2019 and resulted no acute findings, bilateral renal stones, and an enlarged steatotic liver.  Interval History:  She presents to the office today for continued follow-up.  She reports doing well since her last visit. She is very hopeful and feels this year will be a good year. No concerning findings for recurrence voiced.  She is currently working closely with her primary care provider to manage her A1C.  She states she is on Ozempic, previously on metformin.  Her last A1c was 6.2.  She is tolerating her diet with no nausea or emesis.  Appetite is good.  No abdominal or pelvic pain reported.  No abdominal bloating or early satiety reported.  Denies chest pain, dyspnea, new cough.  No new concerns voiced with healed incisions.  Her bladder and bowels are functioning without difficulty.  Notes small amount of bright red rectal bleeding after straining to have a BM, hemorrhoid related she feels. No vaginal bleeding or discharge reported.  She is doing well on estradiol and Effexor.  She states that hot flashes have improved greatly.  She continues to work with [employer].  She was seen by an orthopedic earlier this year with the recommendation a DEXA scan and plans to continue on an anti-inflammatory (voltaren) with close monitoring for her chronic lower back  pain.  She will talk to her PCP about arranging the dexa scan. She reports having great results in her eczema with use of Dupixent. Her oldest daughter is living in [place] and is studying to be a Chief Executive Officer. Her youngest graduated high school last year.  Review of Systems  Constitutional: Feels well. No fever, chills, early satiety, change in appetite. Hot flashes  and vasomotor symptoms have improved significantly with effexor and estradiol.  Cardiovascular: No chest pain, shortness of breath. No edema reported. Pulmonary: No cough or wheeze.  Gastrointestinal: No nausea, vomiting, or diarrhea. No change in bowel movement. Notes small amount of bright red rectal bleeding after straining to have a BM, hemorrhoid related  Genitourinary: No frequency or dysuria. No vaginal bleeding or discharge.  Musculoskeletal: Continues on anti-inflammatory. Neurologic: No new weakness, numbness, or change in gait.  Psychology: No depression. Insomnia related to hot flashes improved.  Health Maintenance: Mammogram: Gets this with [facility] through PCP Pap Smear: Not indicated Colonoscopy: Has not had  Current Meds:  Outpatient Encounter Medications as of 06/20/2022  Medication Sig   acetaminophen (TYLENOL) 325 MG tablet Take 500 mg by mouth as needed. Patient takes medication as needed for pain.   cetirizine (ZYRTEC) 5 MG tablet Take by mouth.   Cholecalciferol (VITAMIN D3) 1.25 MG (50000 UT) CAPS Take 25 mcg by mouth.   clindamycin (CLEOCIN) 150 MG capsule TAKE 4 CAPSULES BY MOUTH 1 HOUR BEFORE APPOINTMENT   diclofenac (VOLTAREN) 75 MG EC tablet SMARTSIG:1 Tablet(s) By Mouth Morning-Evening   estradiol (ESTRACE) 0.5 MG tablet Take 1 tablet (0.5 mg total) by mouth daily.   losartan-hydrochlorothiazide (HYZAAR) 100-12.5 MG tablet Take 1 tablet by mouth daily.   omeprazole (PRILOSEC) 20 MG capsule Take 1 capsule by mouth daily.   OZEMPIC, 0.25 OR 0.5 MG/DOSE, 2 MG/1.5ML SOPN SMARTSIG:0.38 Milliliter(s) SUB-Q Once a Week   rosuvastatin (CRESTOR) 5 MG tablet Take by mouth.   venlafaxine XR (EFFEXOR-XR) 75 MG 24 hr capsule Take 1 capsule (75 mg total) by mouth daily with breakfast.   DUPIXENT 300 MG/2ML prefilled syringe Inject into the skin.   [DISCONTINUED] dicyclomine (BENTYL) 20 MG tablet Take 20 mg by mouth 4 (four) times daily. (Patient not taking: Reported on  04/14/2021)   [DISCONTINUED] metFORMIN (GLUCOPHAGE-XR) 500 MG 24 hr tablet TAKE ONE TABLET (500 MG TOTAL) BY MOUTH WITH BREAKFAST.   [DISCONTINUED] VYVANSE 40 MG capsule Take 40 mg by mouth every morning.   No facility-administered encounter medications on file as of 06/20/2022.    Allergy:  Allergies  Allergen Reactions   Amoxicillin Hives    Social Hx:   Social History   Socioeconomic History   Marital status: Married    Spouse name: Not on file   Number of children: Not on file   Years of education: Not on file   Highest education level: Not on file  Occupational History   Not on file  Tobacco Use   Smoking status: Former    Packs/day: 0.25    Years: 2.00    Additional pack years: 0.00    Total pack years: 0.50    Types: Cigarettes    Quit date: 06/17/1998    Years since quitting: 24.0   Smokeless tobacco: Never  Vaping Use   Vaping Use: Never used  Substance and Sexual Activity   Alcohol use: No    Alcohol/week: 0.0 standard drinks of alcohol   Drug use: No   Sexual activity: Yes    Birth control/protection: Condom  Other Topics Concern   Not on file  Social History Narrative   Not on file   Social Determinants of Health   Financial Resource Strain: Not on file  Food Insecurity: Not on file  Transportation Needs: Not on file  Physical Activity: Not on file  Stress: Not on file  Social Connections: Not on file  Intimate Partner Violence: Not on file    Past Surgical Hx:  Past Surgical History:  Procedure Laterality Date   BREAST REDUCTION SURGERY     CESAREAN SECTION     x 2   FOOT SURGERY Right    JOINT REPLACEMENT Left 12/15   total knee   KNEE SURGERY Left    scope, acl x 6   KNEE SURGERY Right    scope with partial acl   ROBOTIC ASSISTED TOTAL HYSTERECTOMY WITH BILATERAL SALPINGO OOPHERECTOMY N/A 06/23/2014   Procedure: ROBOTIC ASSISTED TOTAL HYSTERECTOMY WITH BILATERAL SALPINGO OOPHORECTOMY/SENTINAL LYMPHNODE LEFT PELVIC LYMPH BIOPSY;   Surgeon: Everitt Amber, MD;  Location: [facility];  Service: Gynecology;  Laterality: N/A;   TONSILLECTOMY      Past Medical Hx:  Past Medical History:  Diagnosis Date   Cancer    endometrial   Osteoarthritis     Family Hx:  Family History  Problem Relation Age of Onset   Diabetes Father     Vitals:  Blood pressure 128/74, pulse 83, temperature 98.5 F (36.9 C), temperature source Oral, resp. rate 20, height 5' 7.5" (1.715 m), weight (!) 329 lb (149.2 kg), last menstrual period 05/19/2014, SpO2 96 %.  Physical Exam:  General: Well developed, well nourished female in no acute distress. Alert and oriented x 3.  Neck: Supple without any enlargements.  Lymph node survey: No cervical, supraclavicular, inguinal adenopathy.  Cardiovascular: Regular rate and rhythm. S1 and S2 normal.  Lungs: Clear to auscultation bilaterally. No wheezes/crackles/rhonchi noted.  Skin: No rashes or lesions present. Back: No CVA tenderness.  Abdomen: Abdomen soft, non-tender and obese. Active bowel sounds in all quadrants. No evidence of a fluid wave or abdominal masses but limited due to habitus. Incisions are well healed without evidence of herniation. With RUQ port site, a small, palpable nodule of tissue remains and is unchanged.  Genitourinary:    Vulva/vagina: Normal external female genitalia. No lesions.    Urethra: No lesions or masses.    Vagina: No lesions. No palpable masses. No vaginal bleeding or drainage noted. Vaginal cuff intact. No cul de sac fullness.  Rectal: Good tone, no masses, no cul de sac nodularity.  Extremities: No bilateral cyanosis, edema, or clubbing.   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 No LVSI, 15% myometrial invasion, negative pelvic washings and negative lymph nodes. No evidence of recurrence on today's exam. She is doing well on the current dosing of  effexor and estradiol so plan to continue. Reportable signs and symptoms reviewed. She is to follow up in one year or sooner if needed and is advised to call closer to the date to schedule.  She is congratulated on and encouraged to continue her weight loss efforts, improving A1C. She is advised to call for any needs or concerns.  She will follow up with her PCP about her mammogram and recommended dexa scan from her recent orthopedic visit on 04/04/22.  Dorothyann Gibbs, NP 06/20/2022, 11:00 AM  .risr

## 2022-06-20 NOTE — Patient Instructions (Addendum)
No concerning findings on today's exam. Congratulations on your weight loss efforts.   Plan to follow up in one year with our office for your continued survivorship.   Please call [phone] closer to the date (Jan 2025) to schedule.   You can continue on the current estrogen and effexor dosing and please call for any new symptoms.  Plan to get your mammogram with your PCP and ask about the need for a bone density scan. (This was recommended by ortho at your last visit).  Symptoms to report to your health care team include vaginal bleeding, rectal bleeding, bloating, weight loss without effort, new and persistent pain, new and  persistent fatigue, new leg swelling, new masses (i.e., bumps in your neck or groin), new and persistent cough, new and persistent nausea and vomiting, change in bowel or bladder habits, and any other concerns.

## 2022-11-29 ENCOUNTER — Encounter: Payer: Self-pay | Admitting: Radiology

## 2023-06-03 ENCOUNTER — Other Ambulatory Visit: Payer: Self-pay | Admitting: Gynecologic Oncology

## 2023-06-03 DIAGNOSIS — N951 Menopausal and female climacteric states: Secondary | ICD-10-CM

## 2023-06-03 DIAGNOSIS — C541 Malignant neoplasm of endometrium: Secondary | ICD-10-CM

## 2023-08-27 ENCOUNTER — Telehealth: Payer: Self-pay | Admitting: *Deleted

## 2023-08-27 NOTE — Telephone Encounter (Signed)
-----   Message from Suellyn Emory sent at 08/27/2023  2:26 PM EDT ----- Pt is over 5 years out from diagnosis. She can start the medication, [id number], recommended by the dermatologist under their guidance.

## 2023-08-27 NOTE — Telephone Encounter (Signed)
 Spoke with patient after she left a message for [employer], NP.in regards to rinvoq that her dermatologist recommended and wanted to make sure our office approved.   Melissa states patient is 5 years out from diagnosis. She can start the medication rinvoq recommended by the dermatologist under their guidance. Pt verbalized understanding and thanked the office for calling back.
